# Patient Record
Sex: Male | Born: 1990 | Race: Black or African American | Hispanic: No | Marital: Married | Smoking: Former smoker
Health system: Southern US, Community
[De-identification: ages and names within clinical notes are randomized; demographics above are authoritative.]

## PROBLEM LIST (undated history)

## (undated) DIAGNOSIS — F419 Anxiety disorder, unspecified: Secondary | ICD-10-CM

---

## 2014-04-09 ENCOUNTER — Encounter (HOSPITAL_COMMUNITY): Payer: Self-pay | Admitting: Emergency Medicine

## 2014-04-09 ENCOUNTER — Emergency Department (HOSPITAL_COMMUNITY)
Admission: EM | Admit: 2014-04-09 | Discharge: 2014-04-09 | Disposition: A | Discharge status: Home / Self Care | Payer: Self-pay | Attending: Emergency Medicine | Admitting: Emergency Medicine

## 2014-04-09 DIAGNOSIS — Z720 Tobacco use: Secondary | ICD-10-CM | POA: Insufficient documentation

## 2014-04-09 DIAGNOSIS — K644 Residual hemorrhoidal skin tags: Secondary | ICD-10-CM | POA: Insufficient documentation

## 2014-04-09 MED ORDER — DIBUCAINE 1 % EX OINT: TOPICAL_OINTMENT | Freq: Four times a day (QID) | CUTANEOUS | Status: DC | PRN

## 2014-04-09 MED ORDER — DOCUSATE SODIUM 100 MG PO CAPS: 100.0000 mg | ORAL_CAPSULE | Freq: Two times a day (BID) | ORAL | Status: DC

## 2014-04-09 MED ORDER — HYDROCODONE-ACETAMINOPHEN 5-325 MG PO TABS: 1.0000 | ORAL_TABLET | ORAL | Status: DC | PRN

## 2014-04-09 MED ORDER — PHENYLEPH-SHARK LIV OIL-MO-PET 0.25-3-14-71.9 % RE OINT: 1.0000 "application " | TOPICAL_OINTMENT | Freq: Two times a day (BID) | RECTAL | Status: DC | PRN

## 2014-04-09 NOTE — ED Provider Notes (Signed)
CSN: [id number]     Arrival date & time 04/09/14  1538 History   First MD Initiated Contact with Patient 04/09/14 1735     Chief Complaint  Patient presents with  . Hemorrhoids     (Consider location/radiation/quality/duration/timing/severity/associated sxs/prior Treatment) HPI   This is a 23 year old male who presents emergency Department with chief complaint of hemorrhoid pain. Patient states he has had intermittent bouts of hemorrhoid flare up since the age of 23. He states that this is the worst one. He complains of a large external hemorrhoid that gives him pain. He denies any internal rectal pain or pain with defecation. The patient denies any fevers, chills, nausea, vomiting, or other signs of systemic infection. He has tried nothing at home except for cold sits baths, which he feels are helpful.   History reviewed. No pertinent past medical history. History reviewed. No pertinent past surgical history. History reviewed. No pertinent family history. History  Substance Use Topics  . Smoking status: Current Every Day Smoker  . Smokeless tobacco: Not on file  . Alcohol Use: Yes     Comment: occ    Review of Systems  Ten systems reviewed and are negative for acute change, except as noted in the HPI.    Allergies  Review of patient's allergies indicates no known allergies.  Home Medications   Prior to Admission medications   Medication Sig Start Date End Date Taking? Authorizing Provider  OVER THE COUNTER MEDICATION Apply 1 application topically daily as needed (hemmroidal pain).   Yes Historical Provider, MD  dibucaine (NUPERCAINAL) 1 % ointment Apply topically 4 (four) times daily as needed for pain. 04/09/14   Arthor CaptainAbigail Vernie Vinciguerra, PA-C  docusate sodium (COLACE) 100 MG capsule Take 1 capsule (100 mg total) by mouth every 12 (twelve) hours. 04/09/14   Arthor CaptainAbigail Kloi Brodman, PA-C  HYDROcodone-acetaminophen (NORCO) 5-325 MG per tablet Take 1 tablet by mouth every 4 (four) hours as  needed. 04/09/14   Arthor CaptainAbigail Lainy Wrobleski, PA-C  phenylephrine-shark liver oil-mineral oil-petrolatum (PREPARATION H) 0.25-3-14-71.9 % rectal ointment Place 1 application rectally 2 (two) times daily as needed for hemorrhoids. 04/09/14   Arthor CaptainAbigail Derryck Shahan, PA-C   BP 96/74 mmHg  Pulse 57  Temp(Src) 97.9 F (36.6 C) (Oral)  Resp 18  Ht 6\' 1"  (1.854 m)  Wt 171 lb (77.565 kg)  BMI 22.57 kg/m2  SpO2 100% Physical Exam  Constitutional: He is oriented to person, place, and time. He appears well-developed and well-nourished. No distress.  HENT:  Head: Normocephalic and atraumatic.  Eyes: Conjunctivae are normal. No scleral icterus.  Neck: Normal range of motion. Neck supple.  Cardiovascular: Normal rate, regular rhythm and normal heart sounds.   Pulmonary/Chest: Effort normal and breath sounds normal. No respiratory distress.  Abdominal: Soft. There is no tenderness.  Genitourinary: Rectal exam shows external hemorrhoid. Rectal exam shows anal tone normal.     Musculoskeletal: He exhibits no edema.  Neurological: He is alert and oriented to person, place, and time.  Skin: Skin is warm and dry. He is not diaphoretic.  Psychiatric: His behavior is normal.  Nursing note and vitals reviewed.   ED Course  Procedures (including critical care time) Labs Review Labs Reviewed - No data to display  Imaging Review No results found.   EKG Interpretation None      MDM   Final diagnoses:  External hemorrhoids without complication    Patient with uncomplicated external hemorrhoid. Will d/c with referral to [facility],  And supportive care. He is insisting immediate medical  care discussed.    Arthor CaptainAbigail Christiana Gurevich, PA-C 04/09/14 1842  Audree CamelScott T Goldston, MD 04/09/14 [phone]

## 2014-04-09 NOTE — ED Notes (Signed)
Pt c/o hemorrhoid that has grown in size and having pain x several days

## 2014-04-09 NOTE — Discharge Instructions (Signed)
Hemorrhoids °Hemorrhoids are swollen veins around the rectum or anus. There are two types of hemorrhoids:  °· Internal hemorrhoids. These occur in the veins just inside the rectum. They may poke through to the outside and become irritated and painful. °· External hemorrhoids. These occur in the veins outside the anus and can be felt as a painful swelling or hard lump near the anus. °CAUSES °· Pregnancy.   °· Obesity.   °· Constipation or diarrhea.   °· Straining to have a bowel movement.   °· Sitting for long periods on the toilet. °· Heavy lifting or other activity that caused you to strain. °· Anal intercourse. °SYMPTOMS  °· Pain.   °· Anal itching or irritation.   °· Rectal bleeding.   °· Fecal leakage.   °· Anal swelling.   °· One or more lumps around the anus.   °DIAGNOSIS  °Your caregiver may be able to diagnose hemorrhoids by visual examination. Other examinations or tests that may be performed include:  °· Examination of the rectal area with a gloved hand (digital rectal exam).   °· Examination of anal canal using a small tube (scope).   °· A blood test if you have lost a significant amount of blood. °· A test to look inside the colon (sigmoidoscopy or colonoscopy). °TREATMENT °Most hemorrhoids can be treated at home. However, if symptoms do not seem to be getting better or if you have a lot of rectal bleeding, your caregiver may perform a procedure to help make the hemorrhoids get smaller or remove them completely. Possible treatments include:  °· Placing a rubber band at the base of the hemorrhoid to cut off the circulation (rubber band ligation).   °· Injecting a chemical to shrink the hemorrhoid (sclerotherapy).   °· Using a tool to burn the hemorrhoid (infrared light therapy).   °· Surgically removing the hemorrhoid (hemorrhoidectomy).   °· Stapling the hemorrhoid to block blood flow to the tissue (hemorrhoid stapling).   °HOME CARE INSTRUCTIONS  °· Eat foods with fiber, such as whole grains, beans,  nuts, fruits, and vegetables. Ask your doctor about taking products with added fiber in them (fiber supplements). °· Increase fluid intake. Drink enough water and fluids to keep your urine clear or pale yellow.   °· Exercise regularly.   °· Go to the bathroom when you have the urge to have a bowel movement. Do not wait.   °· Avoid straining to have bowel movements.   °· Keep the anal area dry and clean. Use wet toilet paper or moist towelettes after a bowel movement.   °· Medicated creams and suppositories may be used or applied as directed.   °· Only take over-the-counter or prescription medicines as directed by your caregiver.   °· Take warm sitz baths for 15-20 minutes, 3-4 times a day to ease pain and discomfort.   °· Place ice packs on the hemorrhoids if they are tender and swollen. Using ice packs between sitz baths may be helpful.   °¨ Put ice in a plastic bag.   °¨ Place a towel between your skin and the bag.   °¨ Leave the ice on for 15-20 minutes, 3-4 times a day.   °· Do not use a donut-shaped pillow or sit on the toilet for long periods. This increases blood pooling and pain.   °SEEK MEDICAL CARE IF: °· You have increasing pain and swelling that is not controlled by treatment or medicine. °· You have uncontrolled bleeding. °· You have difficulty or you are unable to have a bowel movement. °· You have pain or inflammation outside the area of the hemorrhoids. °MAKE SURE YOU: °· Understand these instructions. °·   Will watch your condition. °· Will get help right away if you are not doing well or get worse. °Document Released: 04/08/2000 Document Revised: 03/28/2012 Document Reviewed: 02/14/2012 °[employer]® Patient Information ©2015 [employer]. This information is not intended to replace advice given to you by your health care provider. Make sure you discuss any questions you have with your health care provider. ° °Hemorrhoidectomy °Hemorrhoidectomy is surgery to remove hemorrhoids. Hemorrhoids are veins  that have become swollen in the rectum. The rectum is the area from the bottom end of the intestines to the opening where bowel movements leave the body. Hemorrhoids can be uncomfortable. They can cause itching, bleeding and pain if a blood clot forms in them (thrombose). If hemorrhoids are small, surgery may not be needed. But if they cover a larger area, surgery is usually suggested.  °LET YOUR CAREGIVER KNOW ABOUT:  °· Any allergies. °· All medications you are taking, including: °¨ Herbs, eyedrops, over-the-counter medications and creams. °¨ Blood thinners (anticoagulants), aspirin or other drugs that could affect blood clotting. °· Use of steroids (by mouth or as creams). °· Previous problems with anesthetics, including local anesthetics. °· Possibility of pregnancy, if this applies. °· Any history of blood clots. °· Any history of bleeding or other blood problems. °· Previous surgery. °· Smoking history. °· Other health problems. °RISKS AND COMPLICATIONS °All surgery carries some risk. However, hemorrhoid surgery usually goes smoothly. Possible complications could include: °· Urinary retention. °· Bleeding. °· Infection. °· A painful incision. °· A reaction to the anesthesia (this is not common). °BEFORE THE PROCEDURE  °· Stop using aspirin and non-steroidal anti-inflammatory drugs (NSAIDs) for pain relief. This includes prescription drugs and over-the-counter drugs such as ibuprofen and naproxen. Also stop taking vitamin E. If possible, do this two weeks before your surgery. °· If you take blood-thinners, ask your healthcare provider when you should stop taking them. °· You will probably have blood and urine tests done several days before your surgery. °· Do not eat or drink for about 8 hours before the surgery. °· Arrive at least an hour before the surgery, or whenever your surgeon recommends. This will give you time to check in and fill out any needed paperwork. °· Hemorrhoidectomy is often an outpatient  procedure. This means you will be able to go home the same day. Sometimes, though, people stay overnight in the hospital after the procedure. Ask your surgeon what to expect. Either way, make arrangements in advance for someone to drive you home. °PROCEDURE  °· The preparation: °¨ You will change into a hospital gown. °¨ You will be given an IV. A needle will be inserted in your arm. Medication can flow directly into your body through this needle. °¨ You might be given an enema to clear your rectum. °¨ Once in the operating room, you will probably lie on your side or be repositioned later to lying on your stomach. °¨ You will be given anesthesia (medication) so you will not feel anything during the surgery. The surgery often is done with local anesthesia (the area near the hemorrhoids will be numb and you will be drowsy but awake). Sometimes, general anesthesia is used (you will be asleep during the procedure). °· The procedure: °¨ There are a few different procedures for hemorrhoids. Be sure to ask you surgeon about the procedure, the risks and benefits. °¨ Be sure to ask about what you need to do to take care of the wound, if there is one. °AFTER THE PROCEDURE °·   You will stay in a recovery area until the anesthesia has worn off. Your blood pressure and pulse will be checked every so often. °· You may feel a lot of pain in the area of the rectum. °¨ Take all pain medication prescribed by your surgeon. Ask before taking any over-the-counter pain medicines. °¨ Sometimes sitting in a warm bath can help relieve your pain. °· To make sure you have bowel movements without straining: °¨ You will probably need to take stool softeners (usually a pill) for a few days. °¨ You should drink 8 to 10 glasses of water each day. °· Your activity will be restricted for awhile. Ask your caregiver for a list of what you should and should not do while you recover. °Document Released: 02/06/2009 Document Revised: 07/04/2011 Document  Reviewed: 02/06/2009 °[employer]® Patient Information ©2015 [employer]. This information is not intended to replace advice given to you by your health care provider. Make sure you discuss any questions you have with your health care provider. ° °

## 2015-03-06 ENCOUNTER — Emergency Department (HOSPITAL_COMMUNITY)
Admission: EM | Admit: 2015-03-06 | Discharge: 2015-03-06 | Disposition: A | Discharge status: Home / Self Care | Payer: Medicaid Other | Attending: Emergency Medicine | Admitting: Emergency Medicine

## 2015-03-06 ENCOUNTER — Encounter (HOSPITAL_COMMUNITY): Payer: Self-pay

## 2015-03-06 DIAGNOSIS — R200 Anesthesia of skin: Secondary | ICD-10-CM

## 2015-03-06 DIAGNOSIS — R202 Paresthesia of skin: Secondary | ICD-10-CM | POA: Insufficient documentation

## 2015-03-06 DIAGNOSIS — F410 Panic disorder [episodic paroxysmal anxiety] without agoraphobia: Secondary | ICD-10-CM

## 2015-03-06 DIAGNOSIS — R42 Dizziness and giddiness: Secondary | ICD-10-CM | POA: Diagnosis not present

## 2015-03-06 DIAGNOSIS — Z87891 Personal history of nicotine dependence: Secondary | ICD-10-CM | POA: Diagnosis not present

## 2015-03-06 DIAGNOSIS — F430 Acute stress reaction: Secondary | ICD-10-CM | POA: Diagnosis not present

## 2015-03-06 NOTE — ED Provider Notes (Signed)
Patient went for a morning jog this morning. One hour after he completed the run he developed pain in his left arm and left leg accompanied by lightheadedness. He also developed tingling in all of his fingers Symptoms are similar to panic attacks he has had in the past. He is presently asymptomatic without treatment. No other associated symptoms on exam alert classical coma score 15 lungs clear to auscultation heart regular rate and rhythm abdomen nondistended nontender all forks from his eye redness or tenderness neurovascular intact.Doug Sou.   Tadashi Burkel, MD 03/06/15 [phone]

## 2015-03-06 NOTE — ED Notes (Signed)
Urine at bedside. 

## 2015-03-06 NOTE — Discharge Instructions (Signed)
You have been seen today for arm and leg numbness. Follow up with PCP as needed. Return to ED should symptoms recur.    Emergency Department Resource Guide 1) Find a Doctor and Pay Out of Pocket Although you won't have to find out who is covered by your insurance plan, it is a good idea to ask around and get recommendations. You will then need to call the office and see if the doctor you have chosen will accept you as a new patient and what types of options they offer for patients who are self-pay. Some doctors offer discounts or will set up payment plans for their patients who do not have insurance, but you will need to ask so you aren't surprised when you get to your appointment.  2) Contact Your Local Health Department Not all health departments have doctors that can see patients for sick visits, but many do, so it is worth a call to see if yours does. If you don't know where your local health department is, you can check in your phone book. The CDC also has a tool to help you locate your state's health department, and many state websites also have listings of all of their local health departments.  3) Find a Walk-in Clinic If your illness is not likely to be very severe or complicated, you may want to try a walk in clinic. These are popping up all over the country in pharmacies, drugstores, and shopping centers. They're usually staffed by nurse practitioners or physician assistants that have been trained to treat common illnesses and complaints. They're usually fairly quick and inexpensive. However, if you have serious medical issues or chronic medical problems, these are probably not your best option.  No Primary Care Doctor: - Call Health Connect at  [phone] - they can help you locate a primary care doctor that  accepts your insurance, provides certain services, etc. - Physician Referral Service- [phone]  Chronic Pain Problems: Organization         Address  Phone   Notes  [facility]  [phone] Patients need to be referred by their primary care doctor.   Medication Assistance: Organization         Address  Phone   Notes  [facility] Medication [facility] [address] [place], [place] [address] [phone] --Must be a resident of [facility] -- Must have NO insurance coverage whatsoever (no Medicaid/ Medicare, etc.) -- The pt. MUST have a primary care doctor that directs their care regularly and follows them in the community   [facility]ist  [phone]   [employer]  [phone]    Agencies that provide inexpensive medical care: Organization         Address  Phone   Notes  [facility]  [phone]   [facility]    [phone]   [facility] [address] [place], [place] [address] [phone]   [facility] [address], [place] [phone]   Planned Parenthood    [phone]   [facility]    [phone]   [facility]  [address],  Phone:  [phone], Fax:  [phone] Hours of Operation:  9 am - 6 pm, M-F.  Also accepts Medicaid/Medicare and self-pay.  [facility]  [address], [employer] Phone: [phone])  [phone], Fax: [phone]. Hours of Operation:  8:30 am - 5:30 pm, M-F.  Also accepts Medicaid and self-pay.  [facility] [address], [place] Phone: [phone]   [place], [place], [place] [phone], Ext. [phone] Mondays & Thursdays: 7-9 AM.  First 15 patients are seen on a first come, first serve basis.    [place] Providers:  Organization         Address  Phone   Notes  [facility] [address], [place] [phone] Also accepts self-pay patients.  [facility] [address], [place]  [phone]   [place], [address], [place] [phone]   [facility] [address] [place] [phone]   Lucianne Lei [address], [place]   [phone] Only accepts [place] Access [place] patients after they have their name applied to their card.   Self-Pay (no insurance) in [facility]:  Organization         Address  Phone   Notes  Sickle Cell Patients, [facility] [place] [phone]   [facility] [place] [phone]   [facility] [place]  [place], [place],  [phone]   [facility]/Dr. Osei-Bonsu  [address], [place] or [address], [place] [phone] Phone number for both [place] and [place] locations is the same.  [facility] [address] [place] [phone]   [facility] [address], [place] or [address] [phone]   [facility] [address] [place] [phone], phone; [phone], fax Sees patients 1st and 3rd Saturday of every month.  Must not qualify for public or private insurance (i.e. Medicaid, Medicare, [place] Health Choice, Veterans' Benefits)  Household income should be no more than 200% of the poverty level The clinic cannot treat you if you are pregnant or think you are pregnant  Sexually transmitted diseases are not treated at the clinic.    Dental Care: Organization         Address  Phone  Notes  [facility] [place] [phone] Accepts children up to age 61 who are enrolled in [place] or [place]; pregnant women with a Medicaid card; and children who have applied for Medicaid  or [place] Health Choice, but were declined, whose parents can pay a reduced fee at time of service.  [facility] of [facility]  [address], [place] [phone] Accepts children up to age 65 who are enrolled in [place] or [place]; pregnant women with a Medicaid card; and children who have applied for Medicaid or [place] Health Choice, but were declined, whose parents can pay a reduced fee at time of service.  [facility]  [place] [phone] Patients are seen by appointment only. Walk-ins are not accepted. [place] will see patients 81 years of age and older. Monday - Tuesday (8am-5pm) Most Wednesdays (8:30-5pm) $30 per visit, cash only  [facility]  [address], [employer]  [place] [phone] Patients are seen by appointment only. Walk-ins are not accepted. [place] will see patients 62 years of age and older. One Wednesday Evening (Monthly: Volunteer Based).  $30 per visit, cash only  [place]  [phone] for adults; Children under age 70, call Graduate Pediatric Dentistry at [phone]. Children aged 36-14, please call [phone] to request a pediatric application.  Dental services are provided in all areas of dental care including fillings, crowns and bridges, complete and partial dentures, implants, gum treatment, root canals, and extractions. Preventive care is also provided. Treatment is provided to both adults and children. Patients are selected via a lottery and there is often a waiting list.   [facility] [address] [place]  [phone] [email or web address]   [facility] [address] [place], [place] [phone], Ext. [phone] Second and Fourth Thursday of each month, opens at 6:30 AM; Clinic ends at 9 AM.  Patients are seen on a first-come first-served basis, and a limited number are seen  during each clinic.   [facility]  [address] [place], [place] [phone]   Eligibility Requirements You must have lived in [place], [place], or [place] counties for at least the last three months.   You cannot be eligible for state or federal sponsored [employer], including [employer], [place], or [employer].   You generally cannot be eligible for healthcare insurance through your employer.    How to apply: Eligibility screenings are held every Tuesday and Wednesday afternoon from 1:00 pm until 4:00 pm. You do not need an appointment for the interview!  [facility] [address] [place], [place]   [place]  [place] Department  [place]  [phone]    Behavioral Health Resources in the Community: Intensive Outpatient Programs Organization         Address  Phone  Notes  [facility] [place]. [address], [place], [place] [phone]   [facility] [address] [place], [place]   ADS: [facility] Drug S[facility]cs [address], [place], [place]   [place] [address],  [place], [place] or [phone]   Substance Abuse Resources Organization         Address  Phone  Notes  Alcohol [facility] Drug Services  [phone]   [place]  [phone]   [facility] [place]   Chinita Pester  [phone]   Residential & Outpatient Substance Abuse Program  [phone]   Psychological Services Organization         Address  Phone  Notes  [facility]  [place]  [phone]   [place] [address], [place] or [phone]    Mobile Crisis Teams Organization         Address  Phone  Notes  [facility],  Mobile Crisis Care Unit  [phone]   [facility]  [address] [place], [place]   [place] Levels [address] [place] [phone]    Self-Help/Support Groups Organization         Address  Phone             Notes  [employer] of [place] - variety of support groups  336- [phone] Call for more information  Narcotics Anonymous (NA), [facility] [address], [place]  2 meetings at this location   Residential Sports administratorTreatment Programs Organization         Address  Phone  Notes  [facility] [address],    [place]  [phone]   [facility]  [address], [place] [phone], [place], [place] [phone]   [facility] [address], [place] [phone] Admissions: 8am-3pm M-F  [facility] [address]    [place], [place] [phone]   [facility] [address], [place], [place] [phone]   [facility] [address]  [place], [place] [phone]   [facility] - Intensive Outpatient [address], [place], [place] [phone]   [facility] ([facility] [address]  [place], [place] [phone] or [phone]   [facility] (RTS) [address] [place], [place] [phone] Accepts Medicaid  [facility] [address]  [place] [phone] Substance Abuse/Addiction Treatment   [facility] Behavioral Health Resources Organization         Address  Phone  Notes  [facility]  [phone]) [phone]   Angie FavaJulie Brannon, PhD [address] [place], [place]   [phone] or [phone]) [phone]   [facility]   [address] [place], [place] [phone]) [phone]   [facility] [address], [place], [place] [phone]) [phone] Insurance/Medicaid/sponsorship through [facility]  [facility] [address]                                    [place], [place] [phone]) [phone] Therapy/tele-psych/case  [facility] [address]   [place], [place] [phone](336) [phone]    Dr. Lolly MustacheArfeen  [phone]   [facility]  [employer] [facility] 1) [address], [place] 2) [address], [place] 3)  [address], [place] [phone](336) [phone]  [phone]   [facility] Child Abuse Hotline [phone]) [phone] or [phone](336) [phone] (After Hours)

## 2015-03-06 NOTE — ED Provider Notes (Signed)
CSN: [id number]     Arrival date & time 03/06/15  1348 History   None    Chief Complaint  Patient presents with  . Numbness  . Dizziness     (Consider location/radiation/quality/duration/timing/severity/associated sxs/prior Treatment) HPI   Jeffrey CroakKenneth Oneal is a 24 y.o. male, with a history of Anxiety and Panic Attacks, presenting to the ED with tightness and tingling in left arm and left leg that came on after a run this morning. Describes the tingling, "like my arm and leg fell asleep."  At the time of this interview, pt states he feels normal with no parasthesias, but complains of some muscle soreness, "like I was tensing my muscles up." Pt rates this soreness at about 3/10 and non-radiating. Complains of dizziness once here in the ED. Pt has a history of anxiety and panic attacks and states that his dizziness was probably due to him working himself up from worrying about the arm and leg tingling.  Pt states he has never taken a medication for his anxiety or panic attacks and is usually able to calm himself down. Pt states he has been very stressed lately and attributes to this worse panic attack to his extra stress. Pt has not taken any medications since this episode today. Denies shortness of breath, chest pain or tightness, recent illness, nausea or vomiting, weakness, or any other complaints.  History reviewed. No pertinent past medical history. History reviewed. No pertinent past surgical history. History reviewed. No pertinent family history. Social History  Substance Use Topics  . Smoking status: Former Games developermoker  . Smokeless tobacco: None  . Alcohol Use: Yes     Comment: occ    Review of Systems  Respiratory: Negative for shortness of breath.   Cardiovascular: Negative for chest pain.  Gastrointestinal: Negative for nausea and vomiting.  Neurological: Positive for dizziness and numbness.  All other systems reviewed and are negative.     Allergies  Review of patient's  allergies indicates no known allergies.  Home Medications   Prior to Admission medications   Not on File   BP 116/86 mmHg  Pulse 80  Temp(Src) 97.5 F (36.4 C) (Oral)  Resp 18  SpO2 100% Physical Exam  Constitutional: He is oriented to person, place, and time. He appears well-developed and well-nourished. No distress.  HENT:  Head: Normocephalic and atraumatic.  Eyes: Conjunctivae and EOM are normal. Pupils are equal, round, and reactive to light.  Neck: Normal range of motion. Neck supple.  Cardiovascular: Normal rate, regular rhythm, normal heart sounds and intact distal pulses.   Pulmonary/Chest: Effort normal and breath sounds normal. No respiratory distress.  Abdominal: Soft. Bowel sounds are normal.  Musculoskeletal: Normal range of motion. He exhibits no edema or tenderness.  Lymphadenopathy:    He has no cervical adenopathy.  Neurological: He is alert and oriented to person, place, and time. He has normal reflexes.  No sensory deficits. Strength 5/5 in all extremities. No gait disturbance. Cranial nerves II-XII grossly intact.  Skin: Skin is warm and dry. He is not diaphoretic.  Nursing note and vitals reviewed.   ED Course  Procedures (including critical care time) Labs Review Labs Reviewed - No data to display  Imaging Review No results found. I have personally reviewed and evaluated these images and lab results as part of my medical decision-making.   EKG Interpretation   Date/Time:  Friday March 06 2015 14:06:20 EST Ventricular Rate:  95 PR Interval:  155 QRS Duration: 93 QT Interval:  357  QTC Calculation: 449 R Axis:   71 Text Interpretation:  Sinus rhythm Borderline T wave abnormalities No old  tracing to compare Confirmed by Ethelda Chick  MD, SAM [phone]) on 03/06/2015  4:19:53 PM      MDM   Final diagnoses:  Numbness and tingling of left arm and leg  Panic attack as reaction to stress    Jeffrey Oneal presents with a panic attack that  presented with left arm and leg tingling.   Findings and plan of care discussed with Doug Sou, MD.  That this was a panic attack. All symptoms have resolved, patient feels normal, and he has a completely benign exam. EKG showed sinus rhythm with no ectopics. Plan to observe patient here in the ED and then discharged to home. Plan of care communicated with patient, who agreed to the plan and is comfortable with discharge.    Anselm Pancoast, PA-C 03/06/15 1629  Doug Sou, MD 03/07/15 [phone]

## 2015-03-06 NOTE — ED Notes (Signed)
Pt c/o chest tightness and extremity numbness/tingling since 1230.  Pain score 7/10.  Pt reports that she was "just talking to his girlfriend" when the symptoms began.  Sts "I have anxiety attacks, but I don't take anything for anxiety."  Equal BUE grips, pushes, and pulls noted.

## 2015-03-06 NOTE — Progress Notes (Signed)
Pt verified coverge did indicate a uhc dr - Bronson Curbaniel [place]ermann in [place] Cm provided pt with a list of in network [facility] providers in zip code [phone]  Pt appreciative of care

## 2015-05-01 ENCOUNTER — Encounter (HOSPITAL_COMMUNITY): Payer: Self-pay | Admitting: Family Medicine

## 2015-05-01 ENCOUNTER — Emergency Department (HOSPITAL_COMMUNITY)
Admission: EM | Admit: 2015-05-01 | Discharge: 2015-05-01 | Discharge status: Against Medical Advice | Payer: Medicaid Other | Attending: Emergency Medicine | Admitting: Emergency Medicine

## 2015-05-01 DIAGNOSIS — F430 Acute stress reaction: Secondary | ICD-10-CM | POA: Diagnosis present

## 2015-05-01 DIAGNOSIS — F410 Panic disorder [episodic paroxysmal anxiety] without agoraphobia: Secondary | ICD-10-CM | POA: Insufficient documentation

## 2015-05-01 NOTE — ED Notes (Signed)
Pt here for panic attack after smoking some marijuana. sts that he has been stressed out. Denies SI or HI.

## 2015-10-18 ENCOUNTER — Encounter (HOSPITAL_COMMUNITY): Payer: Self-pay | Admitting: *Deleted

## 2015-10-18 ENCOUNTER — Emergency Department (HOSPITAL_COMMUNITY)
Admission: EM | Admit: 2015-10-18 | Discharge: 2015-10-18 | Disposition: A | Discharge status: Home / Self Care | Payer: 59 | Attending: Emergency Medicine | Admitting: Emergency Medicine

## 2015-10-18 DIAGNOSIS — X58XXXA Exposure to other specified factors, initial encounter: Secondary | ICD-10-CM | POA: Insufficient documentation

## 2015-10-18 DIAGNOSIS — S29012A Strain of muscle and tendon of back wall of thorax, initial encounter: Secondary | ICD-10-CM

## 2015-10-18 DIAGNOSIS — Y939 Activity, unspecified: Secondary | ICD-10-CM | POA: Insufficient documentation

## 2015-10-18 DIAGNOSIS — Y929 Unspecified place or not applicable: Secondary | ICD-10-CM | POA: Insufficient documentation

## 2015-10-18 DIAGNOSIS — Y999 Unspecified external cause status: Secondary | ICD-10-CM | POA: Insufficient documentation

## 2015-10-18 DIAGNOSIS — Z87891 Personal history of nicotine dependence: Secondary | ICD-10-CM | POA: Insufficient documentation

## 2015-10-18 MED ORDER — METHOCARBAMOL 500 MG PO TABS: 500.0000 mg | ORAL_TABLET | Freq: Two times a day (BID) | ORAL | Status: DC

## 2015-10-18 NOTE — ED Notes (Signed)
Pt reports bask spasms to right upper back. Was unable to work yesterday and needs work note. Ambulatory at triage.

## 2015-10-18 NOTE — ED Provider Notes (Signed)
CSN: [id number]     Arrival date & time 10/18/15  1209 History  By signing my name below, I, Tanda RockersMargaux Venter, attest that this documentation has been prepared under the direction and in the presence of Audry Piliyler Jonnae Fonseca, PA-C.  Electronically Signed: Tanda RockersMargaux Venter, ED Scribe. 10/18/2015. 1:04 PM.   Chief Complaint  Patient presents with  . Back Pain   The history is provided by the patient. No language interpreter was used.    HPI Comments: Jeffrey Oneal is a 25 y.o. male who presents to the Emergency Department complaining of gradual onset, constant, right upper back pain and spasms x 4 days. Pt reports that he was unloading a truck at work when he began having mild pain to the area that has since worsened. Pt has had similar pain in the past after lifting weights which he believes was a pinched nerve. He has been applying a heating pad with mild relief. Pt has not taken any medications for the pain. He is also requesting a work note for missing work yesterday due to the pain. Denies fever, chills, urinary or bowel incontinence, saddle anesthesia, weakness, numbness, tingling, or any other associated symptoms.   History reviewed. No pertinent past medical history. History reviewed. No pertinent past surgical history. History reviewed. No pertinent family history. Social History  Substance Use Topics  . Smoking status: Former Games developermoker  . Smokeless tobacco: None  . Alcohol Use: Yes     Comment: occ    Review of Systems  Constitutional: Negative for fever and chills.  Gastrointestinal:       Negative for bowel incontinence  Genitourinary:       Negative for urinary incontinence  Musculoskeletal: Positive for back pain.  Neurological: Negative for weakness and numbness.   Allergies  Review of patient's allergies indicates no known allergies.  Home Medications   Prior to Admission medications   Not on File   BP 109/80 mmHg  Pulse 55  Temp(Src) 98.4 F (36.9 C) (Oral)  Resp 18  SpO2  100%   Physical Exam  Constitutional: He is oriented to person, place, and time. He appears well-developed and well-nourished. No distress.  HENT:  Head: Normocephalic and atraumatic.  Eyes: Conjunctivae and EOM are normal.  Neck: Neck supple. No tracheal deviation present.  Cardiovascular: Normal rate.   Pulmonary/Chest: Effort normal. No respiratory distress.  Musculoskeletal: Normal range of motion. He exhibits tenderness.       Cervical back: Normal.       Thoracic back: Normal.       Lumbar back: Normal.  TTP along right trapezius next to thoracic spine No tenderness on left.  No spinous process tenderness  Neurological: He is alert and oriented to person, place, and time.  Skin: Skin is warm and dry.  Psychiatric: He has a normal mood and affect. His behavior is normal.  Nursing note and vitals reviewed.  ED Course  Procedures (including critical care time)  DIAGNOSTIC STUDIES: Oxygen Saturation is 100% on RA, normal by my interpretation.    COORDINATION OF CARE: 1:01 PM-Discussed treatment plan which includes Rx muscle relaxant with pt at bedside and pt agreed to plan.   Labs Review Labs Reviewed - No data to display  Imaging Review No results found. I have personally reviewed and evaluated these images and lab results as part of my medical decision-making.   EKG Interpretation None      MDM  I have reviewed the relevant previous healthcare records. I obtained HPI from  historian.  ED Course:  Assessment: Patient with back pain.  No neurological deficits and normal neuro exam.  Patient is ambulatory.  No loss of bowel or bladder control.  No concern for cauda equina.  No fever, night sweats, weight loss, h/o cancer, IVDA, no recent procedure to back. No urinary symptoms suggestive of UTI.  Supportive care and return precaution discussed. Likely muscle strain. Pt agreeable to plan and discharge. Appears safe for discharge at this time. Follow up as indicated in  discharge paperwork.   Disposition/Plan:  DC home Additional Verbal discharge instructions given and discussed with patient.  Pt Instructed to f/u with PCP in the next week for evaluation and treatment of symptoms. Return precautions given Pt acknowledges and agrees with plan  Supervising Physician Tilden FossaElizabeth Rees, MD   Final diagnoses:  Muscle strain of right upper back, initial encounter     I personally performed the services described in this documentation, which was scribed in my presence. The recorded information has been reviewed and is accurate.      Audry Piliyler Dorothie Wah, PA-C 10/18/15 1308  Tilden FossaElizabeth Rees, MD 10/19/15 0700

## 2015-10-18 NOTE — Discharge Instructions (Signed)
Please read and follow all provided instructions.  Your diagnoses today include:  1. Muscle strain of right upper back, initial encounter    Tests performed today include:  Vital signs. See below for your results today.   Medications prescribed:   Take as prescribed   Home care instructions:  Follow any educational materials contained in this packet.  Follow-up instructions: Please follow-up with your primary care provider for further evaluation of symptoms and treatment   Return instructions:   Please return to the Emergency Department if you do not get better, if you get worse, or new symptoms OR  - Fever (temperature greater than 101.8F)  - Bleeding that does not stop with holding pressure to the area    -Severe pain (please note that you may be more sore the day after your accident)  - Chest Pain  - Difficulty breathing  - Severe nausea or vomiting  - Inability to tolerate food and liquids  - Passing out  - Skin becoming red around your wounds  - Change in mental status (confusion or lethargy)  - New numbness or weakness     Please return if you have any other emergent concerns.  Additional Information:  Your vital signs today were: BP 109/80 mmHg   Pulse 55   Temp(Src) 98.4 F (36.9 C) (Oral)   Resp 18   SpO2 100% If your blood pressure (BP) was elevated above 135/85 this visit, please have this repeated by your doctor within one month. ---------------

## 2015-10-18 NOTE — ED Notes (Signed)
Declined W/C at D/C and was escorted to lobby by RN. 

## 2015-12-06 ENCOUNTER — Emergency Department (HOSPITAL_COMMUNITY)
Admission: EM | Admit: 2015-12-06 | Discharge: 2015-12-06 | Disposition: A | Discharge status: Home / Self Care | Payer: Medicaid Other | Attending: Dermatology | Admitting: Dermatology

## 2015-12-06 ENCOUNTER — Encounter (HOSPITAL_COMMUNITY): Payer: Self-pay | Admitting: Emergency Medicine

## 2015-12-06 DIAGNOSIS — Z5321 Procedure and treatment not carried out due to patient leaving prior to being seen by health care provider: Secondary | ICD-10-CM | POA: Insufficient documentation

## 2015-12-06 DIAGNOSIS — F419 Anxiety disorder, unspecified: Secondary | ICD-10-CM | POA: Insufficient documentation

## 2015-12-06 HISTORY — DX: Anxiety disorder, unspecified: F41.9

## 2015-12-06 NOTE — ED Notes (Signed)
Looked in room. Pt no longer there with significant other.

## 2015-12-06 NOTE — ED Triage Notes (Signed)
Pt reports he was very anxious earlier today due mother's end of life care. Pt then began to have bilateral hand spasms and tingling and then had a syncopal episode. Pt denies any pain. Hx of panic attacks, but has not had one this bad.

## 2015-12-06 NOTE — ED Notes (Signed)
Pt wants to leave prior to being seen. Pt calmer at present, moving hands, and eating a doughnut with his significant other. Explained need for blood work. Pt and significant other verbalized understanding.

## 2017-06-03 ENCOUNTER — Encounter (HOSPITAL_COMMUNITY): Payer: Self-pay | Admitting: *Deleted

## 2017-06-03 ENCOUNTER — Other Ambulatory Visit: Payer: Self-pay

## 2017-06-03 ENCOUNTER — Emergency Department (HOSPITAL_COMMUNITY)
Admission: EM | Admit: 2017-06-03 | Discharge: 2017-06-03 | Disposition: A | Discharge status: Home / Self Care | Payer: 59 | Attending: Emergency Medicine | Admitting: Emergency Medicine

## 2017-06-03 DIAGNOSIS — J069 Acute upper respiratory infection, unspecified: Secondary | ICD-10-CM

## 2017-06-03 DIAGNOSIS — Z87891 Personal history of nicotine dependence: Secondary | ICD-10-CM | POA: Insufficient documentation

## 2017-06-03 DIAGNOSIS — Z79899 Other long term (current) drug therapy: Secondary | ICD-10-CM | POA: Insufficient documentation

## 2017-06-03 LAB — INFLUENZA PANEL BY PCR (TYPE A & B)
INFLBPCR: NEGATIVE
Influenza A By PCR: NEGATIVE

## 2017-06-03 MED ORDER — DEXAMETHASONE SODIUM PHOSPHATE 10 MG/ML IJ SOLN
10.0000 mg | Freq: Once | INTRAMUSCULAR | Status: AC
  Administered 2017-06-03: 10 mg via INTRAMUSCULAR
  Filled 2017-06-03: qty 1

## 2017-06-03 MED ORDER — OSELTAMIVIR PHOSPHATE 75 MG PO CAPS: 75.0000 mg | ORAL_CAPSULE | Freq: Two times a day (BID) | ORAL | 0 refills | Status: DC

## 2017-06-03 MED ORDER — PREDNISONE 10 MG PO TABS: 20.0000 mg | ORAL_TABLET | Freq: Every day | ORAL | 0 refills | Status: AC

## 2017-06-03 MED ORDER — FLUTICASONE PROPIONATE 50 MCG/ACT NA SUSP: 2.0000 | Freq: Every day | NASAL | 0 refills | Status: DC

## 2017-06-03 NOTE — ED Notes (Signed)
Pt understood dc material. NAD Noted. Scripts given at dc. 

## 2017-06-03 NOTE — ED Triage Notes (Signed)
Headache also 

## 2017-06-03 NOTE — ED Provider Notes (Signed)
[facility] Provider Note   CSN: [id number] Arrival date & time: 06/03/17  2041     History   Chief Complaint Chief Complaint  Patient presents with  . Generalized Body Aches    HPI Jeffrey CroakKenneth Nanez is a 27 y.o. male presenting for evaluation of nasal congestion and sneezing.  Pt states that yesterday, he started to develop dry throat, nasal congestion, and increased sneezing.  He reports bilateral eye irritation.  He has had subjective fevers, and reports he has been feeling more tired than normal.  He has not tried anything for his symptoms.  He denies ear pain, sore throat, cough, chest pain, difficulty breathing, nausea, vomiting, abdominal pain, urinary symptoms, abnormal bowel movements.  He has no other medical problems, does not take medications daily.  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  Multiple family members at home are sick with similar symptoms.  HPI  Past Medical History:  Diagnosis Date  . Anxiety     There are no active problems to display for this patient.   History reviewed. No pertinent surgical history.     Home Medications    Prior to Admission medications   Medication Sig Start Date End Date Taking? Authorizing Provider  fluticasone (FLONASE) 50 MCG/ACT nasal spray Place 2 sprays into both nostrils daily. 06/03/17   Keven Osborn, PA-C  methocarbamol (ROBAXIN) 500 MG tablet Take 1 tablet (500 mg total) by mouth 2 (two) times daily. 10/18/15   Audry PiliMohr, Tyler, PA-C  oseltamivir (TAMIFLU) 75 MG capsule Take 1 capsule (75 mg total) by mouth every 12 (twelve) hours. 06/03/17   Dericka Ostenson, PA-C  predniSONE (DELTASONE) 10 MG tablet Take 2 tablets (20 mg total) by mouth daily for 4 days. 06/03/17 06/07/17  Antionetta Ator, PA-C    Family History No family history on file.  Social History Social History   Tobacco Use  . Smoking status: Former Games developermoker  . Smokeless tobacco: Never  Used  Substance Use Topics  . Alcohol use: Yes    Comment: occ  . Drug use: Yes    Frequency: 3.0 times per week    Types: Marijuana     Allergies   Patient has no known allergies.   Review of Systems Review of Systems  Constitutional: Positive for fever (subjective).  HENT: Positive for congestion. Negative for sore throat.   Eyes:       Eye irritation  Respiratory: Negative for cough, chest tightness and shortness of breath.   Cardiovascular: Negative for chest pain.  Gastrointestinal: Negative for abdominal pain, nausea and vomiting.     Physical Exam Updated Vital Signs BP 136/79 (BP Location: Right Arm)   Pulse 86   Temp 98.7 F (37.1 C) (Oral)   Resp 16   Ht 6\' 1"  (1.854 m)   Wt 95.3 kg (210 lb)   SpO2 100%   BMI 27.71 kg/m   Physical Exam  Constitutional: He is oriented to person, place, and time. He appears well-developed and well-nourished. No distress.  HENT:  Head: Normocephalic and atraumatic.  Right Ear: Tympanic membrane, external ear and ear canal normal.  Left Ear: Tympanic membrane, external ear and ear canal normal.  Nose: Mucosal edema present. Right sinus exhibits no maxillary sinus tenderness and no frontal sinus tenderness. Left sinus exhibits no maxillary sinus tenderness and no frontal sinus tenderness.  Mouth/Throat: Uvula is midline, oropharynx is clear and moist and mucous membranes are normal. No tonsillar  exudate.  Nasal mucosal edema.  OP clear without tonsillar swelling or exudate.  Uvula midline with equal palate rise.  TMs nonerythematous and not bulging bilaterally.  Eyes: Conjunctivae and EOM are normal. Pupils are equal, round, and reactive to light. Right eye exhibits no discharge. Left eye exhibits no discharge.  No conjunctival or scleral injection or drainage.  Neck: Normal range of motion.  Cardiovascular: Normal rate, regular rhythm and intact distal pulses.  Pulmonary/Chest: Effort normal and breath sounds normal. He has  no decreased breath sounds. He has no wheezes. He has no rhonchi. He has no rales.  Pt speaking in full sentences without difficulty. Clear lung sounds in all fields.   Abdominal: Soft. He exhibits no distension. There is no tenderness.  Musculoskeletal: Normal range of motion.  Lymphadenopathy:    He has cervical adenopathy.  Neurological: He is alert and oriented to person, place, and time.  Skin: Skin is warm.  Psychiatric: He has a normal mood and affect.  Nursing note and vitals reviewed.    ED Treatments / Results  Labs (all labs ordered are listed, but only abnormal results are displayed) Labs Reviewed  INFLUENZA PANEL BY PCR (TYPE A & B)    EKG  EKG Interpretation None       Radiology No results found.  Procedures Procedures (including critical care time)  Medications Ordered in ED Medications  dexamethasone (DECADRON) injection 10 mg (10 mg Intramuscular Given 06/03/17 2157)     Initial Impression / Assessment and Plan / ED Course  I have reviewed the triage vital signs and the nursing notes.  Pertinent labs & imaging results that were available during my care of the patient were reviewed by me and considered in my medical decision making (see chart for details).     Patient presenting for evaluation of URI symptoms that began yesterday.  Physical exam reassuring, he is afebrile, tachycardia improved without treatment.  He appears nontoxic.  Pulmonary exam reassuring, doubt pneumonia. Doubt strep, AOM, or other bacterial infection.  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  Doubt flu, as pt does not have high fevers or cough,  however will swab as patient requests.  Likely viral URI.  Discussed findings with patient.  Discussed symptomatic treatment.  Will prescribe Tamiflu for patient to fill if flu is positive.  Decadron given in the ER for symptom relief.  At this time, patient appears to be discharge.  Return precautions given.  Patient states he  understands and agrees to plan.   Final Clinical Impressions(s) / ED Diagnoses   Final diagnoses:  Upper respiratory tract infection, unspecified type    ED Discharge Orders        Ordered    fluticasone (FLONASE) 50 MCG/ACT nasal spray  Daily     06/03/17 2142    predniSONE (DELTASONE) 10 MG tablet  Daily     06/03/17 2142    oseltamivir (TAMIFLU) 75 MG capsule  Every 12 hours     06/03/17 2142       Raneen Jaffer, PA-C 06/03/17 2251    Tilden Fossa, MD 06/04/17 [phone]

## 2017-06-03 NOTE — Discharge Instructions (Signed)
You likely have a viral illness.  This should be treated symptomatically. Use Tylenol or ibuprofen as needed for fevers or body aches. Use Flonase daily for nasal congestion and cough. Take prednisone daily.  If flu test is positive, you may start tamiflu.  Make sure you stay well-hydrated with water. Wash your hands frequently to prevent spread of infection. Return to the emergency room if you develop chest pain, difficulty breathing, or any new or worsening symptoms.

## 2017-06-03 NOTE — ED Triage Notes (Signed)
The p[t has a cold and a cough with body aches poss temp

## 2017-07-10 ENCOUNTER — Other Ambulatory Visit: Payer: Self-pay

## 2017-07-10 ENCOUNTER — Emergency Department (HOSPITAL_COMMUNITY): Admission: EM | Admit: 2017-07-10 | Discharge: 2017-07-10 | Discharge status: Against Medical Advice | Payer: 59

## 2017-08-01 ENCOUNTER — Encounter (HOSPITAL_COMMUNITY): Payer: Self-pay

## 2017-08-01 ENCOUNTER — Other Ambulatory Visit: Payer: Self-pay

## 2017-08-01 ENCOUNTER — Emergency Department (HOSPITAL_COMMUNITY)
Admission: EM | Admit: 2017-08-01 | Discharge: 2017-08-01 | Disposition: A | Discharge status: Home / Self Care | Payer: 59 | Attending: Emergency Medicine | Admitting: Emergency Medicine

## 2017-08-01 DIAGNOSIS — J029 Acute pharyngitis, unspecified: Secondary | ICD-10-CM

## 2017-08-01 DIAGNOSIS — R058 Other specified cough: Secondary | ICD-10-CM

## 2017-08-01 DIAGNOSIS — R05 Cough: Secondary | ICD-10-CM | POA: Insufficient documentation

## 2017-08-01 DIAGNOSIS — Z87891 Personal history of nicotine dependence: Secondary | ICD-10-CM | POA: Insufficient documentation

## 2017-08-01 LAB — GROUP A STREP BY PCR: GROUP A STREP BY PCR: NOT DETECTED

## 2017-08-01 MED ORDER — DEXAMETHASONE SODIUM PHOSPHATE 10 MG/ML IJ SOLN
10.0000 mg | Freq: Once | INTRAMUSCULAR | Status: AC
  Administered 2017-08-01: 10 mg via INTRAMUSCULAR
  Filled 2017-08-01: qty 1

## 2017-08-01 NOTE — ED Provider Notes (Signed)
[facility] EMERGENCY DEPARTMENT Provider Note   CSN: [id number] Arrival date & time: 08/01/17  0807     History   Chief Complaint No chief complaint on file.   HPI Jeffrey Oneal is a 27 y.o. male resents today for evaluation of acute onset, progressively worsening sore throat and nonproductive cough since yesterday. He states that yesterday he awoke feeling a slight tingle to the back of his throat which worsened overnight. He notes pain with swallowing but denies drooling, throat tightness, or facial swelling. He denies fevers or chills. He denies nasal congestion. He does endorses nonproductive cough but denies shortness of breath or chest pain. He states the pain  In the throatis sharp and radiates to the right ear.he has tried drinking diluted apple cider vinegar with some relief of his symptoms. No other medications prior to arrival. 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 He is a nonsmoker. The history is provided by the patient.    Past Medical History:  Diagnosis Date  . Anxiety     There are no active problems to display for this patient.   History reviewed. No pertinent surgical history.      Home Medications    Prior to Admission medications   Medication Sig Start Date End Date Taking? Authorizing Provider  fluticasone (FLONASE) 50 MCG/ACT nasal spray Place 2 sprays into both nostrils daily. Patient not taking: Reported on 08/01/2017 06/03/17   Caccavale, Sophia, PA-C  methocarbamol (ROBAXIN) 500 MG tablet Take 1 tablet (500 mg total) by mouth 2 (two) times daily. Patient not taking: Reported on 08/01/2017 10/18/15   Audry Pili, PA-C    Family History No family history on file.  Social History Social History   Tobacco Use  . Smoking status: Former Games developer  . Smokeless tobacco: Never Used  Substance Use Topics  . Alcohol use: Yes    Comment: occ  . Drug use: Yes    Frequency: 3.0 times per week    Types: Marijuana      Allergies   Patient has no known allergies.   Review of Systems Review of Systems  Constitutional: Negative for chills and fever.  HENT: Positive for ear pain and sore throat. Negative for ear discharge, facial swelling and trouble swallowing.   Respiratory: Positive for cough. Negative for shortness of breath.   Cardiovascular: Negative for chest pain.     Physical Exam Updated Vital Signs BP 120/76 (BP Location: Right Arm)   Pulse 88   Temp 98.7 F (37.1 C) (Oral)   Resp 18   SpO2 96%   Physical Exam  Constitutional: He appears well-developed and well-nourished. No distress.  HENT:  Head: Normocephalic and atraumatic.  Right Ear: Tympanic membrane, external ear and ear canal normal.  Left Ear: Tympanic membrane, external ear and ear canal normal.  Nose: Nose normal. Right sinus exhibits no maxillary sinus tenderness and no frontal sinus tenderness. Left sinus exhibits no maxillary sinus tenderness and no frontal sinus tenderness.  Mouth/Throat: Uvula is midline and mucous membranes are normal. No trismus in the jaw. No uvula swelling. Posterior oropharyngeal edema and posterior oropharyngeal erythema present. No oropharyngeal exudate or tonsillar abscesses. Tonsils are 2+ on the right. Tonsils are 2+ on the left. No tonsillar exudate.  Speaking in full sentences, tolerating secretions without difficulty.  Eyes: Conjunctivae are normal. Right eye exhibits no discharge. Left eye exhibits no discharge.  Neck: Trachea normal, normal range of motion, full passive range of motion  without pain and phonation normal. Neck supple. No JVD present. No tracheal deviation present. No thyroid mass and no thyromegaly present.  Left anterior cervical lymphadenopathy.  Cardiovascular: Normal rate, regular rhythm and normal heart sounds.  Pulmonary/Chest: Effort normal and breath sounds normal. No stridor. No respiratory distress. He has no wheezes. He has no rales. He exhibits no  tenderness.  Equal rise and fall of chest, no increased work of breathing, speaking in full sentences without difficulty.  Abdominal: He exhibits no distension.  Musculoskeletal: He exhibits no edema.  Lymphadenopathy:    He has cervical adenopathy.  Neurological: He is alert.  Skin: Skin is warm and dry. No erythema.  Psychiatric: He has a normal mood and affect. His behavior is normal.  Nursing note and vitals reviewed.    ED Treatments / Results  Labs (all labs ordered are listed, but only abnormal results are displayed) Labs Reviewed  GROUP A STREP BY PCR  RAPID STREP SCREEN (NOT AT [facility])    EKG None  Radiology No results found.  Procedures Procedures (including critical care time)  Medications Ordered in ED Medications  dexamethasone (DECADRON) injection 10 mg (has no administration in time range)     Initial Impression / Assessment and Plan / ED Course  I have reviewed the triage vital signs and the nursing notes.  Pertinent labs & imaging results that were available during my care of the patient were reviewed by me and considered in my medical decision making (see chart for details).     Pt afebrile without tonsillar exudate, negative strep. Presents with mild cervical lymphadenopathy, & dysphagia; diagnosis of viral pharyngitis.he is afebrile, vital signs are stable. He is nontoxic in appearance. Cough is nonproductive and lungs are clear to auscultation bilaterally, I doubt pneumonia or acute cardiopulmonary abnormality. Symptoms consistent with viral pharyngitis/viral URI. No abx indicated. He was given IM Decadron in the ED. Discharge with symptomatic tx for pain  Pt does not appear dehydrated, but did discuss importance of water rehydration. Presentation non concerning for PTA or infxn spread to soft tissue. No trismus or uvula deviation. Specific return precautions discussed. Pt able to drink water in ED without difficulty with intact air way. Recommended PCP  follow up. Pt verbalized understanding of and agreement with plan and is safe for discharge home at this time.    Final Clinical Impressions(s) / ED Diagnoses   Final diagnoses:  Sore throat  Dry cough    ED Discharge Orders    None       Bennye AlmFawze, Edee Nifong A, PA-C 08/01/17 1032    Linwood DibblesKnapp, Jon, MD 08/02/17 1627

## 2017-08-01 NOTE — Discharge Instructions (Signed)
Continue to stay well-hydrated. Gargle warm salt water and spit it out for sore throat. May also use cough drops, warm teas,over-the-counter numbing sprays such as Chloraseptic, etc. May take over-the-counter medications for your symptoms.. Alternate 600 mg of ibuprofen and [phone] mg of Tylenol every 3 hours as needed for pain. Do not exceed 4000 mg of Tylenol daily.   Followup with your primary care doctor in 5-7 days for recheck of ongoing symptoms. Return to emergency department for emergent changing or worsening of symptoms such as throat tightness, facial swelling, fever not controlled by ibuprofen or Tylenol,difficulty breathing, or chest pain.

## 2017-08-01 NOTE — ED Triage Notes (Signed)
Patient complains of worsening sore throat and congestion x 1 day, NAD

## 2018-01-02 ENCOUNTER — Emergency Department (HOSPITAL_COMMUNITY)
Admission: EM | Admit: 2018-01-02 | Discharge: 2018-01-02 | Disposition: A | Discharge status: Home / Self Care | Payer: Medicaid Other | Attending: Emergency Medicine | Admitting: Emergency Medicine

## 2018-01-02 ENCOUNTER — Encounter (HOSPITAL_COMMUNITY): Payer: Self-pay | Admitting: Emergency Medicine

## 2018-01-02 DIAGNOSIS — K047 Periapical abscess without sinus: Secondary | ICD-10-CM

## 2018-01-02 DIAGNOSIS — K0889 Other specified disorders of teeth and supporting structures: Secondary | ICD-10-CM | POA: Insufficient documentation

## 2018-01-02 MED ORDER — PENICILLIN V POTASSIUM 500 MG PO TABS: 500.0000 mg | ORAL_TABLET | Freq: Three times a day (TID) | ORAL | 0 refills | Status: DC

## 2018-01-02 MED ORDER — PENICILLIN V POTASSIUM 500 MG PO TABS
500.0000 mg | ORAL_TABLET | Freq: Once | ORAL | Status: AC
  Administered 2018-01-02: 500 mg via ORAL
  Filled 2018-01-02: qty 1

## 2018-01-02 NOTE — ED Provider Notes (Signed)
[facility]-EMERGENCY DEPT Provider Note   CSN: [id number] Arrival date & time: 01/02/18  1025     History   Chief Complaint Chief Complaint  Patient presents with  . Oral Swelling    HPI Jeffrey Oneal is a 27 y.o. male.  HPI Patient is a 27 year old male presents the emergency department with lower abdominal dental pain and swelling over the past 3 days.  He states today he has had purulent material coming out of the side of the tooth.  No difficulty breathing or swallowing.  Pain is mild to moderate in severity and worse with palpation of tooth #20.   Past Medical History:  Diagnosis Date  . Anxiety     There are no active problems to display for this patient.   History reviewed. No pertinent surgical history.      Home Medications    Prior to Admission medications   Medication Sig Start Date End Date Taking? Authorizing Provider  fluticasone (FLONASE) 50 MCG/ACT nasal spray Place 2 sprays into both nostrils daily. Patient not taking: Reported on 08/01/2017 06/03/17   Caccavale, Sophia, PA-C  methocarbamol (ROBAXIN) 500 MG tablet Take 1 tablet (500 mg total) by mouth 2 (two) times daily. Patient not taking: Reported on 08/01/2017 10/18/15   Audry Pili, PA-C  penicillin v potassium (VEETID) 500 MG tablet Take 1 tablet (500 mg total) by mouth 3 (three) times daily. 01/02/18   Azalia Bilis, MD    Family History No family history on file.  Social History Social History   Tobacco Use  . Smoking status: Light Tobacco Smoker    Types: Cigars  . Smokeless tobacco: Never Used  Substance Use Topics  . Alcohol use: Yes    Comment: occ  . Drug use: Yes    Frequency: 3.0 times per week    Types: Marijuana     Allergies   Patient has no known allergies.   Review of Systems Review of Systems  All other systems reviewed and are negative.    Physical Exam Updated Vital Signs BP 122/88 (BP Location: Left Arm)   Pulse 72   Temp 97.9 F  (36.6 C) (Oral)   Resp 16   SpO2 100%   Physical Exam  Constitutional: He is oriented to person, place, and time. He appears well-developed and well-nourished.  HENT:  Head: Normocephalic.  Gingival swelling and fluctuance of tooth #20 on the lingular surface.  Draining purulent material.  Space under his tongue is soft.  Anterior neck is normal.  Tolerating secretions.  Oral airway patent.  No trismus.  No stridor.  Eyes: EOM are normal.  Neck: Normal range of motion.  Pulmonary/Chest: Effort normal.  Abdominal: He exhibits no distension.  Musculoskeletal: Normal range of motion.  Neurological: He is alert and oriented to person, place, and time.  Psychiatric: He has a normal mood and affect.  Nursing note and vitals reviewed.    ED Treatments / Results  Labs (all labs ordered are listed, but only abnormal results are displayed) Labs Reviewed - No data to display  EKG None  Radiology No results found.  Procedures Procedures (including critical care time)  Medications Ordered in ED Medications  penicillin v potassium (VEETID) tablet 500 mg (has no administration in time range)     Initial Impression / Assessment and Plan / ED Course  I have reviewed the triage vital signs and the nursing notes.  Pertinent labs & imaging results that were available during my care of  the patient were reviewed by me and considered in my medical decision making (see chart for details).     Dental abscess and dental infection.  Able to manipulate the dental abscess and express additional purulent material.  This is freely draining abscess.  Home with oral antibiotics and instructions to follow-up with a dentist.  Patient understands return to the emergency department for new or worsening symptoms  Final Clinical Impressions(s) / ED Diagnoses   Final diagnoses:  Dental infection    ED Discharge Orders         Ordered    penicillin v potassium (VEETID) 500 MG tablet  3 times daily      01/02/18 1127           Azalia Bilis, MD 01/02/18 1130

## 2018-01-02 NOTE — ED Triage Notes (Signed)
pt c/o left lower dental swelling for 3 days.

## 2018-01-02 NOTE — Discharge Instructions (Addendum)
Please call a dentist for follow up °

## 2018-04-12 ENCOUNTER — Other Ambulatory Visit: Payer: Self-pay

## 2018-04-12 ENCOUNTER — Encounter (HOSPITAL_COMMUNITY): Payer: Self-pay

## 2018-04-12 ENCOUNTER — Emergency Department (HOSPITAL_COMMUNITY)
Admission: EM | Admit: 2018-04-12 | Discharge: 2018-04-12 | Disposition: A | Discharge status: Home / Self Care | Payer: Medicaid Other | Attending: Emergency Medicine | Admitting: Emergency Medicine

## 2018-04-12 DIAGNOSIS — G43009 Migraine without aura, not intractable, without status migrainosus: Secondary | ICD-10-CM | POA: Insufficient documentation

## 2018-04-12 DIAGNOSIS — Z87891 Personal history of nicotine dependence: Secondary | ICD-10-CM | POA: Insufficient documentation

## 2018-04-12 MED ORDER — METOCLOPRAMIDE HCL 5 MG/ML IJ SOLN
10.0000 mg | Freq: Once | INTRAMUSCULAR | Status: AC
  Administered 2018-04-12: 10 mg via INTRAMUSCULAR
  Filled 2018-04-12: qty 2

## 2018-04-12 MED ORDER — KETOROLAC TROMETHAMINE 60 MG/2ML IM SOLN
60.0000 mg | Freq: Once | INTRAMUSCULAR | Status: AC
  Administered 2018-04-12: 60 mg via INTRAMUSCULAR
  Filled 2018-04-12: qty 2

## 2018-04-12 NOTE — Discharge Instructions (Addendum)
Please follow-up with an optometrist for a thorough eye examination  Take ibuprofen and Tylenol to help with any recurrent headache.  Call the neurologist for follow-up as needed for recurrent migraine type headaches.  Drink lots of fluids.  Eat quality healthy food

## 2018-04-12 NOTE — ED Triage Notes (Signed)
Patient c/o headache x 3 days. Patient c/o sensitivity to light, but denies blurred vision or N/V.

## 2018-04-12 NOTE — ED Provider Notes (Signed)
[facility]-EMERGENCY DEPT Provider Note   CSN: [id number] Arrival date & time: 04/12/18  1130     History   Chief Complaint Chief Complaint  Patient presents with  . Headache    HPI Jeffrey Oneal is a 27 y.o. male.  HPI Patient is a 27 year old male who reports ongoing headache over the past 3 days with associated light sensitivity.  No blurred vision.  Denies head trauma.  Denies nausea vomiting.  Patient is tried over-the-counter medications without improvement in his symptoms.  He previously wore prescription lenses but is not seen optometrist in some time.  He is scheduled to see optometry sometime in late January 2020.  No other complaints at this time.  Denies weakness of his arms or legs.   Past Medical History:  Diagnosis Date  . Anxiety     There are no active problems to display for this patient.   History reviewed. No pertinent surgical history.      Home Medications    Prior to Admission medications   Medication Sig Start Date End Date Taking? Authorizing Provider  fluticasone (FLONASE) 50 MCG/ACT nasal spray Place 2 sprays into both nostrils daily. Patient not taking: Reported on 08/01/2017 06/03/17   Caccavale, Sophia, PA-C  methocarbamol (ROBAXIN) 500 MG tablet Take 1 tablet (500 mg total) by mouth 2 (two) times daily. Patient not taking: Reported on 08/01/2017 10/18/15   Audry PiliMohr, Tyler, PA-C  penicillin v potassium (VEETID) 500 MG tablet Take 1 tablet (500 mg total) by mouth 3 (three) times daily. Patient not taking: Reported on 04/12/2018 01/02/18   Azalia Bilisampos, Milo Solana, MD    Family History Family History  Problem Relation Age of Onset  . Heart failure Mother   . Healthy Father     Social History Social History   Tobacco Use  . Smoking status: Former Smoker    Types: Cigars  . Smokeless tobacco: Never Used  Substance Use Topics  . Alcohol use: Yes    Comment: occ  . Drug use: Not Currently    Frequency: 3.0 times per week   Types: Marijuana     Allergies   Patient has no known allergies.   Review of Systems Review of Systems  All other systems reviewed and are negative.    Physical Exam Updated Vital Signs BP 133/86 (BP Location: Right Arm)   Pulse 69   Temp (!) 97.5 F (36.4 C) (Oral)   Resp 18   Ht 6' 0.75" (1.848 m)   Wt 90.7 kg   SpO2 100%   BMI 26.57 kg/m   Physical Exam Vitals signs and nursing note reviewed.  Constitutional:      Appearance: He is well-developed.  HENT:     Head: Normocephalic and atraumatic.  Eyes:     Pupils: Pupils are equal, round, and reactive to light.  Neck:     Musculoskeletal: Normal range of motion.  Cardiovascular:     Rate and Rhythm: Normal rate and regular rhythm.     Heart sounds: Normal heart sounds.  Pulmonary:     Effort: Pulmonary effort is normal. No respiratory distress.     Breath sounds: Normal breath sounds.  Abdominal:     General: There is no distension.     Palpations: Abdomen is soft.     Tenderness: There is no abdominal tenderness.  Musculoskeletal: Normal range of motion.  Skin:    General: Skin is warm and dry.  Neurological:     Mental Status: He is  alert and oriented to person, place, and time.     Comments: 5/5 strength in major muscle groups of  bilateral upper and lower extremities. Speech normal. No facial asymetry.   Psychiatric:        Judgment: Judgment normal.      ED Treatments / Results  Labs (all labs ordered are listed, but only abnormal results are displayed) Labs Reviewed - No data to display  EKG None  Radiology No results found.  Procedures Procedures (including critical care time)  Medications Ordered in ED Medications  metoCLOPramide (REGLAN) injection 10 mg (10 mg Intramuscular Given 04/12/18 1249)  ketorolac (TORADOL) injection 60 mg (60 mg Intramuscular Given 04/12/18 1250)     Initial Impression / Assessment and Plan / ED Course  I have reviewed the triage vital signs and the  nursing notes.  Pertinent labs & imaging results that were available during my care of the patient were reviewed by me and considered in my medical decision making (see chart for details).     Patient symptoms improved in the emergency department.  Feels much better at this time.  Outpatient primary care follow-up.  Neurology if he gets any recurrent migraine type headaches.  He will need to follow-up with optometry for a full eye examination.  Patient encouraged to return to the emergency department for new or worsening symptoms.  No indication for advanced imaging.  Normal neurologic exam.  No focal deficits.  Final Clinical Impressions(s) / ED Diagnoses   Final diagnoses:  Migraine without aura and without status migrainosus, not intractable    ED Discharge Orders    None       Azalia Bilisampos, Franziska Podgurski, MD 04/12/18 1406

## 2018-04-12 NOTE — ED Notes (Signed)
Pt states that he is feeling much better and is headache is just about gone

## 2018-05-02 DIAGNOSIS — K648 Other hemorrhoids: Secondary | ICD-10-CM | POA: Diagnosis not present

## 2018-05-25 ENCOUNTER — Ambulatory Visit: Payer: Self-pay | Admitting: Family Medicine

## 2019-11-14 ENCOUNTER — Other Ambulatory Visit: Payer: Self-pay

## 2019-11-14 ENCOUNTER — Emergency Department (HOSPITAL_COMMUNITY)
Admission: EM | Admit: 2019-11-14 | Discharge: 2019-11-14 | Disposition: A | Discharge status: Home / Self Care | Payer: Medicaid Other | Attending: Emergency Medicine | Admitting: Emergency Medicine

## 2019-11-14 ENCOUNTER — Emergency Department (HOSPITAL_COMMUNITY): Payer: Medicaid Other

## 2019-11-14 ENCOUNTER — Encounter (HOSPITAL_COMMUNITY): Payer: Self-pay | Admitting: Emergency Medicine

## 2019-11-14 DIAGNOSIS — R0602 Shortness of breath: Secondary | ICD-10-CM | POA: Diagnosis not present

## 2019-11-14 DIAGNOSIS — Z5321 Procedure and treatment not carried out due to patient leaving prior to being seen by health care provider: Secondary | ICD-10-CM | POA: Insufficient documentation

## 2019-11-14 NOTE — ED Triage Notes (Signed)
Patient complaining of sob. Patient O2 Sat - 100%. Patient has a hx of anxiety. Patient states that he feels like he is not getting enough air and he has to take deep breath.

## 2019-11-15 ENCOUNTER — Telehealth: Payer: Self-pay | Admitting: *Deleted

## 2019-11-15 NOTE — Telephone Encounter (Signed)
Jeffrey Oneal presented to the ED and left before being seen by the provider on 11/14/19. The patient has been enrolled in an automated general discharge outreach program and 2 attempts to contact the patient will be made to follow up on their ED visit and subsequent needs. The care management team is available to provide assistance to this patient at any time.   Burnard Bunting, RN, BSN, CCRN [facility] [phone]

## 2020-05-14 ENCOUNTER — Other Ambulatory Visit: Payer: Self-pay

## 2020-05-14 ENCOUNTER — Ambulatory Visit (HOSPITAL_COMMUNITY)
Admission: EM | Admit: 2020-05-14 | Discharge: 2020-05-14 | Disposition: A | Discharge status: Home / Self Care | Payer: Medicaid Other | Attending: Emergency Medicine | Admitting: Emergency Medicine

## 2020-05-14 ENCOUNTER — Encounter (HOSPITAL_COMMUNITY): Payer: Self-pay

## 2020-05-14 DIAGNOSIS — R509 Fever, unspecified: Secondary | ICD-10-CM | POA: Insufficient documentation

## 2020-05-14 DIAGNOSIS — R42 Dizziness and giddiness: Secondary | ICD-10-CM | POA: Diagnosis not present

## 2020-05-14 DIAGNOSIS — B349 Viral infection, unspecified: Secondary | ICD-10-CM

## 2020-05-14 DIAGNOSIS — R519 Headache, unspecified: Secondary | ICD-10-CM | POA: Diagnosis not present

## 2020-05-14 DIAGNOSIS — U071 COVID-19: Secondary | ICD-10-CM | POA: Insufficient documentation

## 2020-05-14 DIAGNOSIS — R5383 Other fatigue: Secondary | ICD-10-CM | POA: Diagnosis not present

## 2020-05-14 DIAGNOSIS — Z87891 Personal history of nicotine dependence: Secondary | ICD-10-CM | POA: Diagnosis not present

## 2020-05-14 LAB — SARS CORONAVIRUS 2 (TAT 6-24 HRS): SARS Coronavirus 2: POSITIVE — AB

## 2020-05-14 NOTE — Discharge Instructions (Addendum)
Your COVID test is pending.  You should self quarantine until the test result is back.    Take Tylenol or ibuprofen as needed for fever or discomfort.  Rest and keep yourself hydrated.    Follow-up with your primary care provider if your symptoms are not improving.     

## 2020-05-14 NOTE — ED Provider Notes (Signed)
[facility]    CSN: [id number] Arrival date & time: 05/14/20  1037      History   Chief Complaint Chief Complaint  Patient presents with  . Fever    HPI Jeffrey Oneal is a 30 y.o. male.   Patient presents with 4 to 5-day history of body aches, headache, fatigue, dizziness.  He states he had a fever for the first 2 days but this is resolved.  He denies rash, chest pain, shortness of breath, abdominal pain, vomiting, diarrhea, or other symptoms.  Treatment attempted at home with Excedrin and zinc.  His medical history includes anxiety.  The history is provided by the patient and medical records.    Past Medical History:  Diagnosis Date  . Anxiety     There are no problems to display for this patient.   History reviewed. No pertinent surgical history.     Home Medications    Prior to Admission medications   Medication Sig Start Date End Date Taking? Authorizing Provider  fluticasone (FLONASE) 50 MCG/ACT nasal spray Place 2 sprays into both nostrils daily. Patient not taking: No sig reported 06/03/17   Caccavale, Sophia, PA-C  methocarbamol (ROBAXIN) 500 MG tablet Take 1 tablet (500 mg total) by mouth 2 (two) times daily. Patient not taking: No sig reported 10/18/15   Audry Pili, PA-C  penicillin v potassium (VEETID) 500 MG tablet Take 1 tablet (500 mg total) by mouth 3 (three) times daily. Patient not taking: No sig reported 01/02/18   Azalia Bilis, MD    Family History Family History  Problem Relation Age of Onset  . Heart failure Mother   . Healthy Father     Social History Social History   Tobacco Use  . Smoking status: Former Smoker    Types: Cigars  . Smokeless tobacco: Never Used  Vaping Use  . Vaping Use: Never used  Substance Use Topics  . Alcohol use: Yes    Comment: occ  . Drug use: Not Currently    Frequency: 3.0 times per week    Types: Marijuana     Allergies   Patient has no known allergies.   Review of Systems Review  of Systems  Constitutional: Positive for fatigue and fever. Negative for chills.  HENT: Negative for ear pain and sore throat.   Eyes: Negative for pain and visual disturbance.  Respiratory: Negative for cough and shortness of breath.   Cardiovascular: Negative for chest pain and palpitations.  Gastrointestinal: Negative for abdominal pain, diarrhea and vomiting.  Genitourinary: Negative for dysuria and hematuria.  Musculoskeletal: Negative for arthralgias and back pain.  Skin: Negative for color change and rash.  Neurological: Positive for dizziness and headaches. Negative for syncope, weakness and numbness.  All other systems reviewed and are negative.    Physical Exam Triage Vital Signs ED Triage Vitals  Enc Vitals Group     BP      Pulse      Resp      Temp      Temp src      SpO2      Weight      Height      Head Circumference      Peak Flow      Pain Score      Pain Loc      Pain Edu?      Excl. in GC?    No data found.  Updated Vital Signs BP 113/76 (BP Location: Right Arm)  Pulse 81   Temp 99.7 F (37.6 C) (Oral)   Resp 16   Wt 205 lb (93 kg)   SpO2 100%   BMI 27.05 kg/m   Visual Acuity Right Eye Distance:   Left Eye Distance:   Bilateral Distance:    Right Eye Near:   Left Eye Near:    Bilateral Near:     Physical Exam Vitals and nursing note reviewed.  Constitutional:      General: He is not in acute distress.    Appearance: He is well-developed and well-nourished. He is not ill-appearing.  HENT:     Head: Normocephalic and atraumatic.     Right Ear: Tympanic membrane normal.     Left Ear: Tympanic membrane normal.     Nose: Nose normal.     Mouth/Throat:     Mouth: Mucous membranes are moist.     Pharynx: Oropharynx is clear.  Eyes:     Conjunctiva/sclera: Conjunctivae normal.  Cardiovascular:     Rate and Rhythm: Normal rate and regular rhythm.     Heart sounds: Normal heart sounds.  Pulmonary:     Effort: Pulmonary effort is  normal. No respiratory distress.     Breath sounds: Normal breath sounds.  Abdominal:     Palpations: Abdomen is soft.     Tenderness: There is no abdominal tenderness. There is no guarding or rebound.  Musculoskeletal:        General: No edema.     Cervical back: Neck supple.  Skin:    General: Skin is warm and dry.     Findings: No rash.  Neurological:     General: No focal deficit present.     Mental Status: He is alert and oriented to person, place, and time.     Gait: Gait normal.  Psychiatric:        Mood and Affect: Mood and affect and mood normal.        Behavior: Behavior normal.      UC Treatments / Results  Labs (all labs ordered are listed, but only abnormal results are displayed) Labs Reviewed - No data to display  EKG   Radiology No results found.  Procedures Procedures (including critical care time)  Medications Ordered in UC Medications - No data to display  Initial Impression / Assessment and Plan / UC Course  I have reviewed the triage vital signs and the nursing notes.  Pertinent labs & imaging results that were available during my care of the patient were reviewed by me and considered in my medical decision making (see chart for details).   Viral illness. COVID pending.  Instructed patient to self quarantine until the test results are back.  Discussed symptomatic treatment including Tylenol, rest, hydration.  Instructed patient to follow up with PCP if his symptoms are not improving.  Patient agrees to plan of care.    Final Clinical Impressions(s) / UC Diagnoses   Final diagnoses:  Viral illness     Discharge Instructions     Your COVID test is pending.  You should self quarantine until the test result is back.    Take Tylenol or ibuprofen as needed for fever or discomfort.  Rest and keep yourself hydrated.    Follow-up with your primary care provider if your symptoms are not improving.        ED Prescriptions    None      PDMP not reviewed this encounter.   Mickie Bail, NP 05/14/20 [phone]

## 2020-05-14 NOTE — ED Triage Notes (Signed)
Patient presents to Urgent Care with complaints of Migraine, felt warm, fatigue, dizziness, body aches since Sunday. Pt states symptoms have not improved. Pt treating with Excedrin and zinc.   Denies abdominal pain.

## 2020-05-15 ENCOUNTER — Telehealth: Payer: Self-pay

## 2020-05-15 NOTE — Telephone Encounter (Signed)
Called to discuss with patient about COVID-19 symptoms and the use of one of the available treatments for those with mild to moderate Covid symptoms and at a high risk of hospitalization.  Pt appears to qualify for outpatient treatment due to co-morbid conditions and/or a member of an at-risk group in accordance with the FDA Emergency Use Authorization.    Symptom onset: 05/09/20 per chart Vaccinated: Unknown Booster? Unknown Immunocompromised? No Qualifiers: None  Unable to reach pt - Left message.   Esther Hardy

## 2020-06-10 ENCOUNTER — Emergency Department (HOSPITAL_COMMUNITY)
Admission: EM | Admit: 2020-06-10 | Discharge: 2020-06-10 | Disposition: A | Discharge status: Home / Self Care | Payer: Medicaid Other | Attending: Emergency Medicine | Admitting: Emergency Medicine

## 2020-06-10 ENCOUNTER — Other Ambulatory Visit: Payer: Self-pay

## 2020-06-10 ENCOUNTER — Encounter (HOSPITAL_COMMUNITY): Payer: Self-pay | Admitting: Emergency Medicine

## 2020-06-10 DIAGNOSIS — F419 Anxiety disorder, unspecified: Secondary | ICD-10-CM | POA: Insufficient documentation

## 2020-06-10 DIAGNOSIS — R42 Dizziness and giddiness: Secondary | ICD-10-CM | POA: Diagnosis not present

## 2020-06-10 DIAGNOSIS — Z87891 Personal history of nicotine dependence: Secondary | ICD-10-CM | POA: Insufficient documentation

## 2020-06-10 DIAGNOSIS — F410 Panic disorder [episodic paroxysmal anxiety] without agoraphobia: Secondary | ICD-10-CM

## 2020-06-10 MED ORDER — LORAZEPAM 0.5 MG PO TABS
0.5000 mg | ORAL_TABLET | Freq: Once | ORAL | Status: AC
  Administered 2020-06-10: 0.5 mg via ORAL
  Filled 2020-06-10: qty 1

## 2020-06-10 MED ORDER — HYDROXYZINE HCL 25 MG PO TABS: 25.0000 mg | ORAL_TABLET | Freq: Four times a day (QID) | ORAL | 0 refills | Status: DC | PRN

## 2020-06-10 NOTE — Discharge Instructions (Signed)
You are seen today for panic attack, as we discussed you may need to start medications for anxiety.  I did prescribe you some Atarax that you can start taking for anxiety, this is a medication that I want you to take as needed, it is not something that you need to take every day.  I attached some instructions on how to manage anxiety and more about panic attacks.  You can get certain apps such as calm which can help you with your breathing when these occur.  I want you to get a PCP, you can use the following instructions on how to do this or he can follow-up with [facility].  If you have any new or worsening concerning symptoms please come back to the emergency department.  Please speak to your pharmacist about any medications prescribed today in regards side effects or interactions with other medications.

## 2020-06-10 NOTE — ED Triage Notes (Signed)
Patient states he has struggled with his anxiety and palpitations recently, but today he had low stress. He was sitting in a chair at work and felt his heart beat really hard once and got a hot flash, told a co-worker he felt like he was going to pass out. Denies LOC, states he feels much better now.

## 2020-06-10 NOTE — ED Provider Notes (Signed)
[facility]-EMERGENCY DEPT Provider Note   CSN: [id number] Arrival date & time: 06/10/20  1244     History Chief Complaint  Patient presents with  . Anxiety    Jeffrey Oneal is a 30 y.o. male with past medical history of anxiety that presents the emerge department today for panic attack.  Patient states that he was sitting in a chair at work, felt as if he was having palpitations, got very hot, had some dizziness, told his coworker that he was about to pass out.  States that this lasted about 15 minutes, now states that most of his symptoms have resolved, however he still feels very anxious.  He states that he felt anxious recently for the past couple months here, states that his anxiety started 3 years ago after his mother had died.  Is not anything for his anxiety.  States that he did not have a trigger today.  Did not pass out, no LOC.  States that his last panic attack was in June of last year, feels similar.  Patient states that he is concerned about his heart, wants an EKG since he was having this palpitations.  Not currently having palpitations.  No chest pain or shortness of breath.  Denies any headache, vision changes, neck pain, abdominal pain, vomiting.  States that he was in his normal health before this.  Does not have a PCP.  No other complaints at this time. HPI     Past Medical History:  Diagnosis Date  . Anxiety     There are no problems to display for this patient.   History reviewed. No pertinent surgical history.     Family History  Problem Relation Age of Onset  . Heart failure Mother   . Healthy Father     Social History   Tobacco Use  . Smoking status: Former Smoker    Types: Cigars  . Smokeless tobacco: Never Used  Vaping Use  . Vaping Use: Never used  Substance Use Topics  . Alcohol use: Yes    Comment: occ  . Drug use: Not Currently    Frequency: 3.0 times per week    Types: Marijuana    Home Medications Prior to  Admission medications   Medication Sig Start Date End Date Taking? Authorizing Provider  hydrOXYzine (ATARAX/VISTARIL) 25 MG tablet Take 1 tablet (25 mg total) by mouth every 6 (six) hours as needed for anxiety. 06/10/20  Yes Suleiman Finigan, PA-C  fluticasone (FLONASE) 50 MCG/ACT nasal spray Place 2 sprays into both nostrils daily. Patient not taking: No sig reported 06/03/17   Caccavale, Sophia, PA-C  methocarbamol (ROBAXIN) 500 MG tablet Take 1 tablet (500 mg total) by mouth 2 (two) times daily. Patient not taking: No sig reported 10/18/15   Audry Pili, PA-C  penicillin v potassium (VEETID) 500 MG tablet Take 1 tablet (500 mg total) by mouth 3 (three) times daily. Patient not taking: No sig reported 01/02/18   Azalia Bilis, MD    Allergies    Patient has no known allergies.  Review of Systems   Review of Systems  Constitutional: Negative for chills, diaphoresis, fatigue and fever.  HENT: Negative for congestion, sore throat and trouble swallowing.   Eyes: Negative for pain and visual disturbance.  Respiratory: Negative for cough, shortness of breath and wheezing.   Cardiovascular: Positive for palpitations. Negative for chest pain and leg swelling.  Gastrointestinal: Negative for abdominal distention, abdominal pain, diarrhea, nausea and vomiting.  Genitourinary: Negative for difficulty  urinating.  Musculoskeletal: Negative for back pain, neck pain and neck stiffness.  Skin: Negative for pallor.  Neurological: Positive for dizziness. Negative for speech difficulty, weakness and headaches.  Psychiatric/Behavioral: Negative for confusion.    Physical Exam Updated Vital Signs BP (!) 134/99 (BP Location: Right Arm)   Pulse 72   Temp 98.2 F (36.8 C) (Oral)   Resp 18   Ht 6\' 1"  (1.854 m)   Wt 93 kg   SpO2 98%   BMI 27.05 kg/m   Physical Exam Constitutional:      General: He is not in acute distress.    Appearance: Normal appearance. He is not ill-appearing, toxic-appearing or  diaphoretic.  HENT:     Head: Normocephalic and atraumatic.     Mouth/Throat:     Mouth: Mucous membranes are moist.     Pharynx: Oropharynx is clear.  Eyes:     General: No scleral icterus.    Extraocular Movements: Extraocular movements intact.     Pupils: Pupils are equal, round, and reactive to light.  Cardiovascular:     Rate and Rhythm: Normal rate and regular rhythm.     Pulses: Normal pulses.     Heart sounds: Normal heart sounds.  Pulmonary:     Effort: Pulmonary effort is normal. No respiratory distress.     Breath sounds: Normal breath sounds. No stridor. No wheezing, rhonchi or rales.  Chest:     Chest wall: No tenderness.  Abdominal:     General: Abdomen is flat. There is no distension.     Palpations: Abdomen is soft.     Tenderness: There is no abdominal tenderness. There is no guarding or rebound.  Musculoskeletal:        General: No swelling or tenderness. Normal range of motion.     Cervical back: Normal range of motion and neck supple. No rigidity.     Right lower leg: No edema.     Left lower leg: No edema.  Skin:    General: Skin is warm and dry.     Capillary Refill: Capillary refill takes less than 2 seconds.     Coloration: Skin is not pale.  Neurological:     General: No focal deficit present.     Mental Status: He is alert and oriented to person, place, and time.     Gait: Gait normal.     Comments: Alert. Clear speech. No facial droop. CNIII-XII grossly intact. Bilateral upper and lower extremities' sensation grossly intact. 5/5 symmetric strength with grip strength and with plantar and dorsi flexion bilaterally.   Psychiatric:     Comments: Patient is very anxious     ED Results / Procedures / Treatments   Labs (all labs ordered are listed, but only abnormal results are displayed) Labs Reviewed - No data to display  EKG EKG Interpretation  Date/Time:  Wednesday June 10 2020 13:55:25 EST Ventricular Rate:  84 PR Interval:    QRS  Duration: 94 QT Interval:  360 QTC Calculation: 426 R Axis:   74 Text Interpretation: Sinus rhythm Borderline T wave abnormalities No acute changes No significant change since last tracing Confirmed by [id number] [phone]) on 06/10/2020 2:54:40 PM   Radiology No results found.  Procedures Procedures   Medications Ordered in ED Medications  LORazepam (ATIVAN) tablet 0.5 mg (0.5 mg Oral Given 06/10/20 1351)    ED Course  I have reviewed the triage vital signs and the nursing notes.  Pertinent labs & imaging results that  were available during my care of the patient were reviewed by me and considered in my medical decision making (see chart for details).    MDM Rules/Calculators/A&P                          Ricard Faulkner is a 30 y.o. male with past medical history of anxiety that presents the emerge department today for panic attack.  Patient appears well currently, normal neuro exam.  Patient very anxious home speaking to him, will give Ativan and reevaluate.  Upon reevaluation, patient states that he feels much better with Ativan.  Patient states that he feels as if his nerves are calm down.  States that he definitely feels as if he had a panic attack.  Does not resources for this, symptomatic treatment and resources provided.  Doubt need for further emergent work up at this time. I explained the diagnosis and have given explicit precautions to return to the ER including for any other new or worsening symptoms. The patient understands and accepts the medical plan as it's been dictated and I have answered their questions. Discharge instructions concerning home care and prescriptions have been given. The patient is STABLE and is discharged to home in good condition.   Final Clinical Impression(s) / ED Diagnoses Final diagnoses:  Anxiety attack    Rx / DC Orders ED Discharge Orders         Ordered    hydrOXYzine (ATARAX/VISTARIL) 25 MG tablet  Every 6 hours PRN         06/10/20 1507           Farrel Gordon, PA-C 06/10/20 1511    Derwood Kaplan, MD 06/10/20 [phone]

## 2020-08-18 ENCOUNTER — Emergency Department (HOSPITAL_COMMUNITY)
Admission: EM | Admit: 2020-08-18 | Discharge: 2020-08-18 | Disposition: A | Discharge status: Home / Self Care | Payer: Medicaid Other | Attending: Emergency Medicine | Admitting: Emergency Medicine

## 2020-08-18 ENCOUNTER — Other Ambulatory Visit: Payer: Self-pay

## 2020-08-18 ENCOUNTER — Encounter (HOSPITAL_COMMUNITY): Payer: Self-pay | Admitting: *Deleted

## 2020-08-18 DIAGNOSIS — Z87891 Personal history of nicotine dependence: Secondary | ICD-10-CM | POA: Insufficient documentation

## 2020-08-18 DIAGNOSIS — R000 Tachycardia, unspecified: Secondary | ICD-10-CM | POA: Insufficient documentation

## 2020-08-18 DIAGNOSIS — R42 Dizziness and giddiness: Secondary | ICD-10-CM | POA: Diagnosis present

## 2020-08-18 DIAGNOSIS — I10 Essential (primary) hypertension: Secondary | ICD-10-CM

## 2020-08-18 DIAGNOSIS — F419 Anxiety disorder, unspecified: Secondary | ICD-10-CM

## 2020-08-18 LAB — CBC WITH DIFFERENTIAL/PLATELET
Abs Immature Granulocytes: 0.01 10*3/uL (ref 0.00–0.07)
Basophils Absolute: 0 10*3/uL (ref 0.0–0.1)
Basophils Relative: 1 %
Eosinophils Absolute: 0.2 10*3/uL (ref 0.0–0.5)
Eosinophils Relative: 2 %
HCT: 45.5 % (ref 39.0–52.0)
Hemoglobin: 14.9 g/dL (ref 13.0–17.0)
Immature Granulocytes: 0 %
Lymphocytes Relative: 35 %
Lymphs Abs: 2.8 10*3/uL (ref 0.7–4.0)
MCH: 31.8 pg (ref 26.0–34.0)
MCHC: 32.7 g/dL (ref 30.0–36.0)
MCV: 97.2 fL (ref 80.0–100.0)
Monocytes Absolute: 0.8 10*3/uL (ref 0.1–1.0)
Monocytes Relative: 10 %
Neutro Abs: 4.3 10*3/uL (ref 1.7–7.7)
Neutrophils Relative %: 52 %
Platelets: 241 10*3/uL (ref 150–400)
RBC: 4.68 MIL/uL (ref 4.22–5.81)
RDW: 11.9 % (ref 11.5–15.5)
WBC: 8.1 10*3/uL (ref 4.0–10.5)
nRBC: 0 % (ref 0.0–0.2)

## 2020-08-18 LAB — COMPREHENSIVE METABOLIC PANEL
ALT: 16 U/L (ref 0–44)
AST: 32 U/L (ref 15–41)
Albumin: 4.8 g/dL (ref 3.5–5.0)
Alkaline Phosphatase: 50 U/L (ref 38–126)
Anion gap: 9 (ref 5–15)
BUN: 9 mg/dL (ref 6–20)
CO2: 25 mmol/L (ref 22–32)
Calcium: 9.5 mg/dL (ref 8.9–10.3)
Chloride: 103 mmol/L (ref 98–111)
Creatinine, Ser: 0.99 mg/dL (ref 0.61–1.24)
GFR, Estimated: >60 mL/min (ref >60)
Glucose, Bld: 126 mg/dL — ABNORMAL HIGH (ref 70–99)
Potassium: 3.2 mmol/L — ABNORMAL LOW (ref 3.5–5.1)
Sodium: 137 mmol/L (ref 135–145)
Total Bilirubin: 1.1 mg/dL (ref 0.3–1.2)
Total Protein: 8.4 g/dL — ABNORMAL HIGH (ref 6.5–8.1)

## 2020-08-18 LAB — URINALYSIS, ROUTINE W REFLEX MICROSCOPIC
Bilirubin Urine: NEGATIVE
Glucose, UA: NEGATIVE mg/dL
Hgb urine dipstick: NEGATIVE
Ketones, ur: NEGATIVE mg/dL
Leukocytes,Ua: NEGATIVE
Nitrite: NEGATIVE
Protein, ur: NEGATIVE mg/dL
Specific Gravity, Urine: 1.017 (ref 1.005–1.030)
pH: 7 (ref 5.0–8.0)

## 2020-08-18 LAB — RAPID URINE DRUG SCREEN, HOSP PERFORMED
Amphetamines: NOT DETECTED
Barbiturates: NOT DETECTED
Benzodiazepines: NOT DETECTED
Cocaine: NOT DETECTED
Opiates: NOT DETECTED
Tetrahydrocannabinol: NOT DETECTED

## 2020-08-18 LAB — PROTIME-INR
INR: 1 (ref 0.8–1.2)
Prothrombin Time: 13.4 seconds (ref 11.4–15.2)

## 2020-08-18 LAB — TSH: TSH: 2.355 u[IU]/mL (ref 0.350–4.500)

## 2020-08-18 LAB — TROPONIN I (HIGH SENSITIVITY)
Troponin I (High Sensitivity): 2 ng/L (ref <18)
Troponin I (High Sensitivity): <2 ng/L (ref <18)

## 2020-08-18 MED ORDER — METOPROLOL TARTRATE 25 MG PO TABS: 12.5000 mg | ORAL_TABLET | Freq: Two times a day (BID) | ORAL | 0 refills | Status: DC

## 2020-08-18 MED ORDER — HYDROXYZINE PAMOATE 25 MG PO CAPS: 25.0000 mg | ORAL_CAPSULE | Freq: Four times a day (QID) | ORAL | 0 refills | Status: DC | PRN

## 2020-08-18 MED ORDER — METOPROLOL TARTRATE 25 MG PO TABS
25.0000 mg | ORAL_TABLET | Freq: Once | ORAL | Status: AC
  Administered 2020-08-18: 25 mg via ORAL
  Filled 2020-08-18: qty 1

## 2020-08-18 MED ORDER — HYDROXYZINE HCL 25 MG PO TABS
50.0000 mg | ORAL_TABLET | Freq: Once | ORAL | Status: AC
  Administered 2020-08-18: 50 mg via ORAL
  Filled 2020-08-18: qty 2

## 2020-08-18 MED ORDER — CLONIDINE HCL 0.1 MG PO TABS
0.1000 mg | ORAL_TABLET | Freq: Once | ORAL | Status: AC
  Administered 2020-08-18: 0.1 mg via ORAL
  Filled 2020-08-18: qty 1

## 2020-08-18 NOTE — ED Notes (Addendum)
Pt's heart rate increased to 167 after telling him his blood pressure was 180/102. Heart rate returned to 90 after taking deep breaths. EKG sinus rhythm. MD Pfeiffer notified and given EKG

## 2020-08-18 NOTE — ED Notes (Signed)
Provided pt with a sandwich and drink

## 2020-08-18 NOTE — ED Provider Notes (Signed)
[facility]-EMERGENCY DEPT Provider Note   CSN: [id number] Arrival date & time: 08/18/20  0801     History Chief Complaint  Patient presents with  . Dizziness  . Tingling    Jeffrey Oneal is a 30 y.o. male.  HPI Patient reports he has had increased blood pressure and heart rate over the past several days.  Patient does not have prior history of hypertension but does have familial hypertension with early onset in his sister.  He reports symptoms became most notable with an anxiety provoking event.  He does not specify a particular event but reports that he is getting married and he is looking for some job changes and it seemed like it really got triggered by cumulative stress.  He reports he really just felt like his blood pressure was elevated and experienced a feeling of lightheadedness and intermittently tingling and numb feeling in both hands and occasionally his face.  No syncopal episodes, no severe headache, no visual changes.  He reports when he feels like his heart is racing he does get a perception of his chest being tighter but not pain.  Patient denies any drug use, no significant alcohol use no current smoking.  Patient reports at baseline he is pretty active and works out, he does not experience chest pain or exertional short of breath with working out.    Past Medical History:  Diagnosis Date  . Anxiety     There are no problems to display for this patient.   History reviewed. No pertinent surgical history.     Family History  Problem Relation Age of Onset  . Heart failure Mother   . Healthy Father     Social History   Tobacco Use  . Smoking status: Former Smoker    Types: Cigars  . Smokeless tobacco: Never Used  Vaping Use  . Vaping Use: Never used  Substance Use Topics  . Alcohol use: Yes    Comment: occ  . Drug use: Not Currently    Frequency: 3.0 times per week    Types: Marijuana    Home Medications Prior to Admission  medications   Medication Sig Start Date End Date Taking? Authorizing Provider  hydrOXYzine (VISTARIL) 25 MG capsule Take 1 capsule (25 mg total) by mouth every 6 (six) hours as needed for anxiety. 08/18/20  Yes Arby Barrette, MD  metoprolol tartrate (LOPRESSOR) 25 MG tablet Take 0.5 tablets (12.5 mg total) by mouth 2 (two) times daily. 08/18/20  Yes Arby Barrette, MD  OVER THE COUNTER MEDICATION Take 2 capsules by mouth daily. Seamoss & Burdock root 500mg    Yes [provider]  fluticasone (FLONASE) 50 MCG/ACT nasal spray Place 2 sprays into both nostrils daily. Patient not taking: No sig reported 06/03/17   Caccavale, Sophia, PA-C  hydrOXYzine (ATARAX/VISTARIL) 25 MG tablet Take 1 tablet (25 mg total) by mouth every 6 (six) hours as needed for anxiety. Patient not taking: Reported on 08/18/2020 06/10/20   06/12/20, PA-C  methocarbamol (ROBAXIN) 500 MG tablet Take 1 tablet (500 mg total) by mouth 2 (two) times daily. Patient not taking: No sig reported 10/18/15   10/20/15, PA-C  penicillin v potassium (VEETID) 500 MG tablet Take 1 tablet (500 mg total) by mouth 3 (three) times daily. Patient not taking: No sig reported 01/02/18   03/04/18, MD    Allergies    Patient has no known allergies.  Review of Systems   Review of Systems 10 systems reviewed  and negative except as per HPI Physical Exam Updated Vital Signs BP (!) 125/91   Pulse 98   Temp 98.6 F (37 C)   Resp 11   SpO2 98%   Physical Exam Constitutional:      Appearance: He is well-developed.     Comments: Well-nourished well-developed.  Patient is anxious in appearance and hyperventilating slightly.  HENT:     Head: Normocephalic and atraumatic.     Mouth/Throat:     Mouth: Mucous membranes are moist.     Pharynx: Oropharynx is clear.  Eyes:     Extraocular Movements: Extraocular movements intact.     Conjunctiva/sclera: Conjunctivae normal.     Pupils: Pupils are equal, round, and reactive to light.   Cardiovascular:     Rate and Rhythm: Normal rate and regular rhythm.     Heart sounds: Normal heart sounds.  Pulmonary:     Effort: Pulmonary effort is normal.     Breath sounds: Normal breath sounds.  Abdominal:     General: Bowel sounds are normal. There is no distension.     Palpations: Abdomen is soft.     Tenderness: There is no abdominal tenderness.  Musculoskeletal:        General: Normal range of motion.     Cervical back: Neck supple.  Skin:    General: Skin is warm and dry.  Neurological:     General: No focal deficit present.     Mental Status: He is alert and oriented to person, place, and time.     GCS: GCS eye subscore is 4. GCS verbal subscore is 5. GCS motor subscore is 6.     Cranial Nerves: No cranial nerve deficit.     Sensory: No sensory deficit.     Motor: No weakness.     Coordination: Coordination normal.  Psychiatric:     Comments: Patient has mildly anxious appearance.  He does have good eye contact.     ED Results / Procedures / Treatments   Labs (all labs ordered are listed, but only abnormal results are displayed) Labs Reviewed  COMPREHENSIVE METABOLIC PANEL - Abnormal; Notable for the following components:      Result Value   Potassium 3.2 (*)    Glucose, Bld 126 (*)    Total Protein 8.4 (*)    All other components within normal limits  CBC WITH DIFFERENTIAL/PLATELET  PROTIME-INR  URINALYSIS, ROUTINE W REFLEX MICROSCOPIC  RAPID URINE DRUG SCREEN, HOSP PERFORMED  TSH  TROPONIN I (HIGH SENSITIVITY)  TROPONIN I (HIGH SENSITIVITY)    EKG EKG Interpretation  Date/Time:  Tuesday August 18 2020 08:12:34 EDT Ventricular Rate:  121 PR Interval:  161 QRS Duration: 92 QT Interval:  317 QTC Calculation: 450 R Axis:   78 Text Interpretation: Sinus tachycardia agree, no change from previous except increase heart rate. Confirmed by Arby Barrette [phone]) on 08/18/2020 10:49:57 AM   Radiology No results found.  Procedures Procedures    Medications Ordered in ED Medications  hydrOXYzine (ATARAX/VISTARIL) tablet 50 mg (50 mg Oral Given 08/18/20 0847)  cloNIDine (CATAPRES) tablet 0.1 mg (0.1 mg Oral Given 08/18/20 0848)  metoprolol tartrate (LOPRESSOR) tablet 25 mg (25 mg Oral Given 08/18/20 1134)    ED Course  I have reviewed the triage vital signs and the nursing notes.  Pertinent labs & imaging results that were available during my care of the patient were reviewed by me and considered in my medical decision making (see chart for details).  MDM Rules/Calculators/A&P                         Patient presents as outlined above.  EKG does not show acute ischemic changes and troponins are normal.  Patient has low risk factors for ACS at this point.  I do suspect some baseline essential hypertension.  Patient's sister has hypertension at fairly young age and patient's diastolic pressures have remained consistently elevated.  Patient's heart rate has been quite variable but sinus rhythm.  EKG does not show any acute changes from previous.  I could observe the patient resting quietly in the room with a heart rate in the 80s and upon coming into the room and starting to talk to him heart rate could go up as high as 150s.  It seemed to be very responsive to patient's anxiety response.  At times he would hyperventilate slightly and was visibly trying to control his breathing with some slow exhaling.  I suspect symptoms of numbness and tingling are secondary to hyperventilation.  Patient's potassium was just of the mild low side.  Patient does not have any PE or DVT risk factors.  He has no peripheral swelling or calf pain and no pleuritic chest pain.  Patient was given clonidine and Atarax with improved symptoms.  Blood pressure normalized but diastolic blood pressure remained at 90.  Heart rate came down to consistently be in the 80s or 90s so long as the patient was not talking about his symptoms.  At this time plan will be to start a  low-dose of Lopressor for suspected essential hypertension and sinus tachycardia.  We will also prescribe Atarax for periods of more intensely perceived stress or anxiety.  I have counseled the patient on extreme importance of close follow-up for his response these medications, monitoring his blood pressures and immediate return with any worsening or changing of symptoms.  Patient reports that his fiance was arranging a PCP follow-up.  He is advised that he can also find follow-up through the discharge instructions. Final Clinical Impression(s) / ED Diagnoses Final diagnoses:  Essential hypertension  Anxiety  Sinus tachycardia    Rx / DC Orders ED Discharge Orders         Ordered    metoprolol tartrate (LOPRESSOR) 25 MG tablet  2 times daily        08/18/20 1243    hydrOXYzine (VISTARIL) 25 MG capsule  Every 6 hours PRN        08/18/20 1243           Arby Barrette, MD 08/20/20 [phone]

## 2020-08-18 NOTE — ED Triage Notes (Signed)
Pt complains of lightheadedness, numbness in both hands, and tingling in his left face and left arm for the past 2.5 days. He first noticed it when he was getting stressed out. He denies pain. He felt like his blood pressure was high but did not take it.

## 2020-08-18 NOTE — ED Notes (Signed)
Pt in room speaking with provider.

## 2020-08-18 NOTE — Discharge Instructions (Signed)
1.  Is very important that you follow-up with your family doctor within the next 4 to 7 days. 2.  Start taking metoprolol as prescribed.  You are to start with 1/2 tablet twice daily.  Monitor your heart rate and your blood pressure and write down the numbers 3-4 times a day for the next 5 days.  If your heart rate is staying greater than 90 and your blood pressure is staying greater than 130/90, increase the metoprolol dose to a full tablet in the morning and continue to monitor your values.. 3.  You have been given a medication called Vistaril to help with anxiety.  Medication can be slightly sedating.  May take a tablet every 6 hours if needed for anxiety.  It is important that you get established with a counselor to further evaluate your symptoms.  May be other medications that are helpful if needed.  Sometimes talk therapy is all that is needed to identify triggers and resolve the issues no long-term medications are needed. 4.  Return to the emergency department if your symptoms are persisting, worsening or new concerning symptoms develop.

## 2020-08-19 ENCOUNTER — Telehealth: Payer: Self-pay

## 2020-08-19 NOTE — Telephone Encounter (Signed)
Transition Care Management Follow-up Telephone Call  Date of discharge and from where: 08/18/2020 from [facility]  How have you been since you were released from the hospital? Pt stated that he is feeling much better today.   Any questions or concerns? No  Items Reviewed:  Did the pt receive and understand the discharge instructions provided? Yes   Medications obtained and verified? Yes   Other? No   Any new allergies since your discharge? No   Dietary orders reviewed? n/a  Do you have support at home? Yes   Functional Questionnaire: (I = Independent and D = Dependent) ADLs: I  Bathing/Dressing- I  Meal Prep- I  Eating- I  Maintaining continence- I  Transferring/Ambulation- I  Managing Meds- I   Follow up appointments reviewed:   PCP Hospital f/u appt confirmed? No  Pt is interested in est with a PCP. Pt given number for   Specialist Hospital f/u appt confirmed? No    Are transportation arrangements needed? No   If their condition worsens, is the pt aware to call PCP or go to the Emergency Dept.? Yes  Was the patient provided with contact information for the PCP's office or ED? Yes  Was to pt encouraged to call back with questions or concerns? Yes

## 2020-09-03 ENCOUNTER — Ambulatory Visit (HOSPITAL_COMMUNITY)
Admission: EM | Admit: 2020-09-03 | Discharge: 2020-09-03 | Disposition: A | Discharge status: Home / Self Care | Payer: Medicaid Other | Attending: Student | Admitting: Student

## 2020-09-03 ENCOUNTER — Encounter (HOSPITAL_COMMUNITY): Payer: Self-pay | Admitting: Emergency Medicine

## 2020-09-03 ENCOUNTER — Other Ambulatory Visit: Payer: Self-pay

## 2020-09-03 DIAGNOSIS — L42 Pityriasis rosea: Secondary | ICD-10-CM

## 2020-09-03 MED ORDER — PREDNISONE 20 MG PO TABS: 40.0000 mg | ORAL_TABLET | Freq: Every day | ORAL | 0 refills | Status: AC

## 2020-09-03 NOTE — ED Triage Notes (Signed)
1 1/2 weeks ago had a rash and now has spread all over back and both arms.  Patient has been using an antifungal wash and says thinks has slightly improved.  Patient reports rash does itch.

## 2020-09-03 NOTE — ED Provider Notes (Signed)
[facility]    CSN: [id number] Arrival date & time: 09/03/20  1303      History   Chief Complaint Chief Complaint  Patient presents with  . Rash    HPI Jeffrey Oneal is a 30 y.o. male presenting with rash.  Medical history noncontributory.  States he first noticed the rash about 1.5 weeks ago as 1 patch on his back.  Since then, he has developed multiple lesions on the back and a few on the chest.  Describes this as very itchy.  Has been using over-the-counter antifungal wash with minimal improvement.  Denies rash elsewhere.  Denies recent URI.  HPI  Past Medical History:  Diagnosis Date  . Anxiety     There are no problems to display for this patient.   History reviewed. No pertinent surgical history.     Home Medications    Prior to Admission medications   Medication Sig Start Date End Date Taking? Authorizing Provider  predniSONE (DELTASONE) 20 MG tablet Take 2 tablets (40 mg total) by mouth daily for 5 days. 09/03/20 09/08/20 Yes Rhys Martini, PA-C  OVER THE COUNTER MEDICATION Take 2 capsules by mouth daily. Seamoss & Burdock root 500mg     [provider]  metoprolol tartrate (LOPRESSOR) 25 MG tablet Take 0.5 tablets (12.5 mg total) by mouth 2 (two) times daily. Patient not taking: Reported on 09/03/2020 08/18/20 09/03/20  11/03/20, MD    Family History Family History  Problem Relation Age of Onset  . Heart failure Mother   . Healthy Father     Social History Social History   Tobacco Use  . Smoking status: Former Smoker    Types: Cigars  . Smokeless tobacco: Never Used  Vaping Use  . Vaping Use: Never used  Substance Use Topics  . Alcohol use: Yes    Comment: occ  . Drug use: Not Currently    Frequency: 3.0 times per week    Types: Marijuana     Allergies   Patient has no known allergies.   Review of Systems Review of Systems  Skin: Positive for rash.  All other systems reviewed and are  negative.    Physical Exam Triage Vital Signs ED Triage Vitals  Enc Vitals Group     BP      Pulse      Resp      Temp      Temp src      SpO2      Weight      Height      Head Circumference      Peak Flow      Pain Score      Pain Loc      Pain Edu?      Excl. in GC?    No data found.  Updated Vital Signs BP 121/84 (BP Location: Right Arm) Comment (BP Location): large cuff  Pulse 90   Temp 98.3 F (36.8 C) (Oral)   Resp 20   SpO2 98%   Visual Acuity Right Eye Distance:   Left Eye Distance:   Bilateral Distance:    Right Eye Near:   Left Eye Near:    Bilateral Near:     Physical Exam Vitals reviewed.  Constitutional:      General: He is not in acute distress.    Appearance: Normal appearance. He is not ill-appearing or diaphoretic.  HENT:     Head: Normocephalic and atraumatic.  Cardiovascular:  Rate and Rhythm: Normal rate and regular rhythm.     Heart sounds: Normal heart sounds.  Pulmonary:     Effort: Pulmonary effort is normal.     Breath sounds: Normal breath sounds.  Skin:    General: Skin is warm.     Comments: Scattered scaly maculopapular rash on back with few patches on abdomen. Herald patch on back, midline.   Neurological:     General: No focal deficit present.     Mental Status: He is alert and oriented to person, place, and time.  Psychiatric:        Mood and Affect: Mood normal.        Behavior: Behavior normal.        Thought Content: Thought content normal.        Judgment: Judgment normal.      UC Treatments / Results  Labs (all labs ordered are listed, but only abnormal results are displayed) Labs Reviewed - No data to display  EKG   Radiology No results found.  Procedures Procedures (including critical care time)  Medications Ordered in UC Medications - No data to display  Initial Impression / Assessment and Plan / UC Course  I have reviewed the triage vital signs and the nursing notes.  Pertinent labs &  imaging results that were available during my care of the patient were reviewed by me and considered in my medical decision making (see chart for details).     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  Discussed that this is a benign condition that will self resolve in few weeks, but prednisone sent in for relief of itching.  ED return precautions discussed.  Final Clinical Impressions(s) / UC Diagnoses   Final diagnoses:  Pityriasis rosea     Discharge Instructions     -Prednisone, 2 pills taken at the same time for 5 days in a row.  Try taking this earlier in the day as it can give you energy.  -You can also take benedryl for symptomatic relief of the itching -The rash from pityriasis rosacea can last several weeks, but it will resolve on its own over time.  The steroid will help speed up the healing. -This rash typically is not contagious between people.    ED Prescriptions    Medication Sig Dispense Auth. Provider   predniSONE (DELTASONE) 20 MG tablet Take 2 tablets (40 mg total) by mouth daily for 5 days. 10 tablet Rhys Martini, PA-C     PDMP not reviewed this encounter.   Rhys Martini, PA-C 09/03/20 1503

## 2020-09-03 NOTE — Discharge Instructions (Addendum)
-  Prednisone, 2 pills taken at the same time for 5 days in a row.  Try taking this earlier in the day as it can give you energy.  -You can also take benedryl for symptomatic relief of the itching -The rash from pityriasis rosacea can last several weeks, but it will resolve on its own over time.  The steroid will help speed up the healing. -This rash typically is not contagious between people.

## 2021-07-22 ENCOUNTER — Encounter (HOSPITAL_COMMUNITY): Payer: Self-pay | Admitting: Emergency Medicine

## 2021-07-22 ENCOUNTER — Emergency Department (HOSPITAL_COMMUNITY)
Admission: EM | Admit: 2021-07-22 | Discharge: 2021-07-22 | Disposition: A | Discharge status: Home / Self Care | Payer: Medicaid Other | Attending: Emergency Medicine | Admitting: Emergency Medicine

## 2021-07-22 DIAGNOSIS — R000 Tachycardia, unspecified: Secondary | ICD-10-CM | POA: Diagnosis not present

## 2021-07-22 DIAGNOSIS — X58XXXA Exposure to other specified factors, initial encounter: Secondary | ICD-10-CM | POA: Insufficient documentation

## 2021-07-22 DIAGNOSIS — F419 Anxiety disorder, unspecified: Secondary | ICD-10-CM | POA: Diagnosis not present

## 2021-07-22 DIAGNOSIS — Y990 Civilian activity done for income or pay: Secondary | ICD-10-CM | POA: Insufficient documentation

## 2021-07-22 DIAGNOSIS — S4992XA Unspecified injury of left shoulder and upper arm, initial encounter: Secondary | ICD-10-CM | POA: Diagnosis present

## 2021-07-22 NOTE — ED Provider Notes (Signed)
?[facility] ?Provider Note ? ? ?CSN: [id number] ?Arrival date & time: 07/22/21  2038 ? ?  ? ?History ? ?Chief Complaint  ?Patient presents with  ? Anxiety  ? ? ?Jeffrey Oneal is a 31 y.o. male with past medical history of anxiety.  Presents emergency department for complaint of anxiety.  Patient states that 4 days prior he was working out and injured his left shoulder.  Patient has been having pain intermittently to his left shoulder since then.  States that this is making very anxious so he came to the emergency department for further evaluation.  Patient states that he has dealt with anxiety on and off his entire life.  Anxiety became worse after 2017 when his child was born his mother passed away from heart attack.  Patient states that this point he became very focused on his health and feels he has a lot of anxiety regarding his health. ? ?Patient denies any chest pain, shortness of breath, nausea, vomiting, diaphoresis, abdominal pain, leg swelling or tenderness, palpitations, lightheadedness, syncope, hemoptysis, SI, HI, AVH. ? ? ?Anxiety ?Pertinent negatives include no chest pain, no abdominal pain, no headaches and no shortness of breath.  ? ?  ? ?Home Medications ?Prior to Admission medications   ?Medication Sig Start Date End Date Taking? Authorizing Provider  ?OVER THE COUNTER MEDICATION Take 2 capsules by mouth daily. Seamoss & Burdock root 500mg     [provider]  ?metoprolol tartrate (LOPRESSOR) 25 MG tablet Take 0.5 tablets (12.5 mg total) by mouth 2 (two) times daily. ?Patient not taking: Reported on 09/03/2020 08/18/20 09/03/20  Charlesetta Shanks, MD  ?   ? ?Allergies    ?Patient has no known allergies.   ? ?Review of Systems   ?Review of Systems  ?Constitutional:  Negative for chills and fever.  ?Eyes:  Negative for visual disturbance.  ?Respiratory:  Negative for shortness of breath.   ?Cardiovascular:  Negative for chest pain and leg swelling.   ?Gastrointestinal:  Negative for abdominal pain, nausea and vomiting.  ?Musculoskeletal:  Positive for arthralgias. Negative for back pain and neck pain.  ?Skin:  Negative for color change and rash.  ?Neurological:  Negative for dizziness, syncope, light-headedness and headaches.  ?Psychiatric/Behavioral:  Negative for confusion, hallucinations, self-injury and suicidal ideas. The patient is nervous/anxious.   ? ?Physical Exam ?Updated Vital Signs ?BP (!) 165/102 (BP Location: Left Arm)   Pulse (!) 113   Temp 98.1 ?F (36.7 ?C)   Resp 16   Ht 6\' 1"  (1.854 m)   Wt 89.8 kg   SpO2 100%   BMI 26.12 kg/m?  ?Physical Exam ?Vitals and nursing note reviewed.  ?Constitutional:   ?   General: He is not in acute distress. ?   Appearance: He is not ill-appearing, toxic-appearing or diaphoretic.  ?HENT:  ?   Head: Normocephalic.  ?Eyes:  ?   General: No scleral icterus.    ?   Right eye: No discharge.     ?   Left eye: No discharge.  ?Cardiovascular:  ?   Rate and Rhythm: Tachycardia present.  ?   Pulses:     ?     Radial pulses are 2+ on the right side and 2+ on the left side.  ?   Heart sounds: Normal heart sounds, S1 normal and S2 normal. No murmur heard. ?Pulmonary:  ?   Effort: Pulmonary effort is normal. No tachypnea, bradypnea or respiratory distress.  ?   Breath sounds: Normal  breath sounds. No stridor.  ?Abdominal:  ?   General: Abdomen is flat. There is no distension. There are no signs of injury.  ?   Palpations: Abdomen is soft. There is no mass or pulsatile mass.  ?   Tenderness: There is no abdominal tenderness. There is no guarding or rebound.  ?Musculoskeletal:  ?   Right lower leg: Normal.  ?   Left lower leg: Normal.  ?Skin: ?   General: Skin is warm and dry.  ?Neurological:  ?   General: No focal deficit present.  ?   Mental Status: He is alert.  ?Psychiatric:     ?   Behavior: Behavior is cooperative.  ? ? ?ED Results / Procedures / Treatments   ?Labs ?(all labs ordered are listed, but only abnormal  results are displayed) ?Labs Reviewed - No data to display ? ?EKG ?EKG Interpretation ? ?Date/Time:  Thursday July 22 2021 21:01:24 EDT ?Ventricular Rate:  113 ?PR Interval:  149 ?QRS Duration: 95 ?QT Interval:  315 ?QTC Calculation: 432 ?R Axis:   73 ?Text Interpretation: Sinus tachycardia Borderline repolarization abnormality Confirmed by Lacretia Leigh ([id number]) on 07/22/2021 9:35:44 PM ? ?Radiology ?No results found. ? ?Procedures ?Procedures  ? ? ?Medications Ordered in ED ?Medications - No data to display ? ?ED Course/ Medical Decision Making/ A&P ?  ?                        ?Medical Decision Making ? ?Alert 31 year old male no acute stress, nontoxic-appearing.  Presents emergency department complaint of anxiety. ? ?Information obtained from patient.  Past medical records were reviewed including previous provider notes, labs, and imaging.  Patient has history as outlined in HPI which complicates care. ? ?Patient reports injuring his left shoulder while working out at the gym.  Patient states that he has been hyper focused over this over the last 4 days.  Due to his fixation on his shoulder he has been very anxious about his health he came to the emergency department for further evaluation.  Patient's not having any chest pain, shortness of breath, palpitations, leg swelling or tenderness. ? ?I personally viewed and interpreted patient's EKG.  Tracing shows sinus tachycardia. ? ?While in the room patient is visibly anxious.  His anxiety increases when blood pressure Is being checked. ? ?Social determinants of health that affect patient care include no PCP. ? ?Patient advised to record blood pressures and follow-up with a PCP provider for further management of hypertension.  Patient given resources for outpatient psychiatric care/counseling. ? ?Discussed results, findings, treatment and follow up. Patient advised of return precautions. Patient verbalized understanding and agreed with plan. ? ?Patient care  discussed with attending physician Dr. Zenia Resides ? ? ? ? ? ? ? ? ?Final Clinical Impression(s) / ED Diagnoses ?Final diagnoses:  ?Anxiety  ? ? ?Rx / DC Orders ?ED Discharge Orders   ? ? None  ? ?  ? ? ?  ?Loni Beckwith, PA-C ?07/23/21 0149 ? ?  ?Lacretia Leigh, MD ?07/26/21 [phone] ? ?

## 2021-07-22 NOTE — ED Triage Notes (Signed)
Pt reports that he has had a lot of health anxiety since his mother passed from a heart attack. He did a workout a couple days ago and his arms and chest have been sore. He states that he has been waking up everyday and not feeling any better and it has made his anxiety about his health worse. He is not on any daily anxiety medication.  ?

## 2021-07-22 NOTE — Discharge Instructions (Addendum)
You came to the [facility] today to be evaluated for your anxiety.  Your physical exam and EKG were reassuring.  I have given you information for resources to help with counseling and therapy in the outpatient setting. ? ?Your blood pressure was found to be elevated while in the emergency department.  Please follow the instructions we discussed and record your blood pressure daily. ? ?Please follow instructions on your discharge paperwork or call the number on your Medicaid card to establish a primary care doctor.  They will be able to manage your blood pressure moving forward. ? ?Please return to the [facility] if you have new or worsening symptoms. ?

## 2021-07-23 ENCOUNTER — Telehealth: Payer: Self-pay

## 2021-07-23 DIAGNOSIS — Z9189 Other specified personal risk factors, not elsewhere classified: Secondary | ICD-10-CM

## 2021-07-23 NOTE — Telephone Encounter (Signed)
Transition Care Management Unsuccessful Follow-up Telephone Call ? ?Date of discharge and from where:  07/22/2021-[facility]  ? ?Attempts:  1st Attempt ? ?Reason for unsuccessful TCM follow-up call:  Left voice message ? ?  ?

## 2021-07-26 ENCOUNTER — Telehealth: Payer: Self-pay

## 2021-07-26 NOTE — Telephone Encounter (Signed)
? ?  Telephone encounter was:  Unsuccessful.  07/26/2021 ?Name: Jeffrey Oneal MRN: [id number] DOB: Jan 24, 1991 ? ?Unsuccessful outbound call made today to assist with:   pcp ? ?Outreach Attempt:  1st Attempt ? ?A HIPAA compliant voice message was left requesting a return call.  Instructed patient to call back at earliest convenience. ?. ? ? ?Lenard Forth ?Care Guide, Embedded Care Coordination ?[place], Care Management  ?[phone] ?[address] [place], [place], [place] [address] ?Phone: [phone] ?Email: [email or web address] ? ?  ?

## 2021-07-26 NOTE — Telephone Encounter (Signed)
Transition Care Management Follow-up Telephone Call ?Date of discharge and from where: 07/22/2021-[facility]  ?How have you been since you were released from the hospital? Pt stated he is feeling a lot better.  ?Any questions or concerns? No ? ?Items Reviewed: ?Did the pt receive and understand the discharge instructions provided? Yes  ?Medications obtained and verified? Yes  ?Other? No  ?Any new allergies since your discharge? No  ?Dietary orders reviewed? No ?Do you have support at home? Yes  ? ?Home Care and Equipment/Supplies: ?Were home health services ordered? not applicable ?If so, what is the name of the agency? N/A  ?Has the agency set up a time to come to the patient's home? not applicable ?Were any new equipment or medical supplies ordered?  No ?What is the name of the medical supply agency? N/A ?Were you able to get the supplies/equipment? not applicable ?Do you have any questions related to the use of the equipment or supplies? No ? ?Functional Questionnaire: (I = Independent and D = Dependent) ?ADLs: I ? ?Bathing/Dressing- I ? ?Meal Prep- I ? ?Eating- I ? ?Maintaining continence- I ? ?Transferring/Ambulation- I ? ?Managing Meds- I ? ?Follow up appointments reviewed: ? ?PCP Hospital f/u appt confirmed? No   ?Specialist Hospital f/u appt confirmed? No   ?Are transportation arrangements needed? No  ?If their condition worsens, is the pt aware to call PCP or go to the Emergency Dept.? Yes ?Was the patient provided with contact information for the PCP's office or ED? Yes ?Was to pt encouraged to call back with questions or concerns? Yes  ?

## 2021-08-03 ENCOUNTER — Telehealth: Payer: Self-pay

## 2021-08-03 NOTE — Telephone Encounter (Signed)
? ?  Telephone encounter was:  Unsuccessful.  08/03/2021 ?Name: Jeffrey Oneal MRN: [id number] DOB: 15-Jun-1990 ? ?Unsuccessful outbound call made today to assist with:   PCP ? ?Outreach Attempt:  2nd Attempt ? ?A HIPAA compliant voice message was left requesting a return call.  Instructed patient to call back at earliest convenience. ?. ? ? ? ?Larena Sox ?Care Guide, Embedded Care Coordination ?[place], Care Management  ?[phone] ?[address] [place], [place],  [address] ?Phone: [phone] ?Email: [email or web address] ? ?  ?

## 2021-08-05 ENCOUNTER — Telehealth: Payer: Self-pay

## 2021-08-05 NOTE — Telephone Encounter (Signed)
? ?  Telephone encounter was:  Unsuccessful.  08/05/2021 ?Name: Jeffrey Oneal MRN: [id number] DOB: [birth year] ? ?Unsuccessful outbound call made today to assist with:   pcp ? ?Outreach Attempt:  3rd Attempt.  Referral closed unable to contact patient. ? ?A HIPAA compliant voice message was left requesting a return call.  Instructed patient to call back at  at earliest convenience.. ? ? ? ?Larena Sox ?Care Guide, Embedded Care Coordination ?[place], Care Management  ?[phone] ?[address], [place], [place] [address] ?Phone: [phone] ?Email: [email or web address] ? ?  ?

## 2022-01-25 IMAGING — CR DG CHEST 2V
2 series · 2 of 2 positions shown · non-contrast
Comparison: None.

CLINICAL DATA: Short of breath

EXAM:
CHEST - 2 VIEW

[w chest pa]
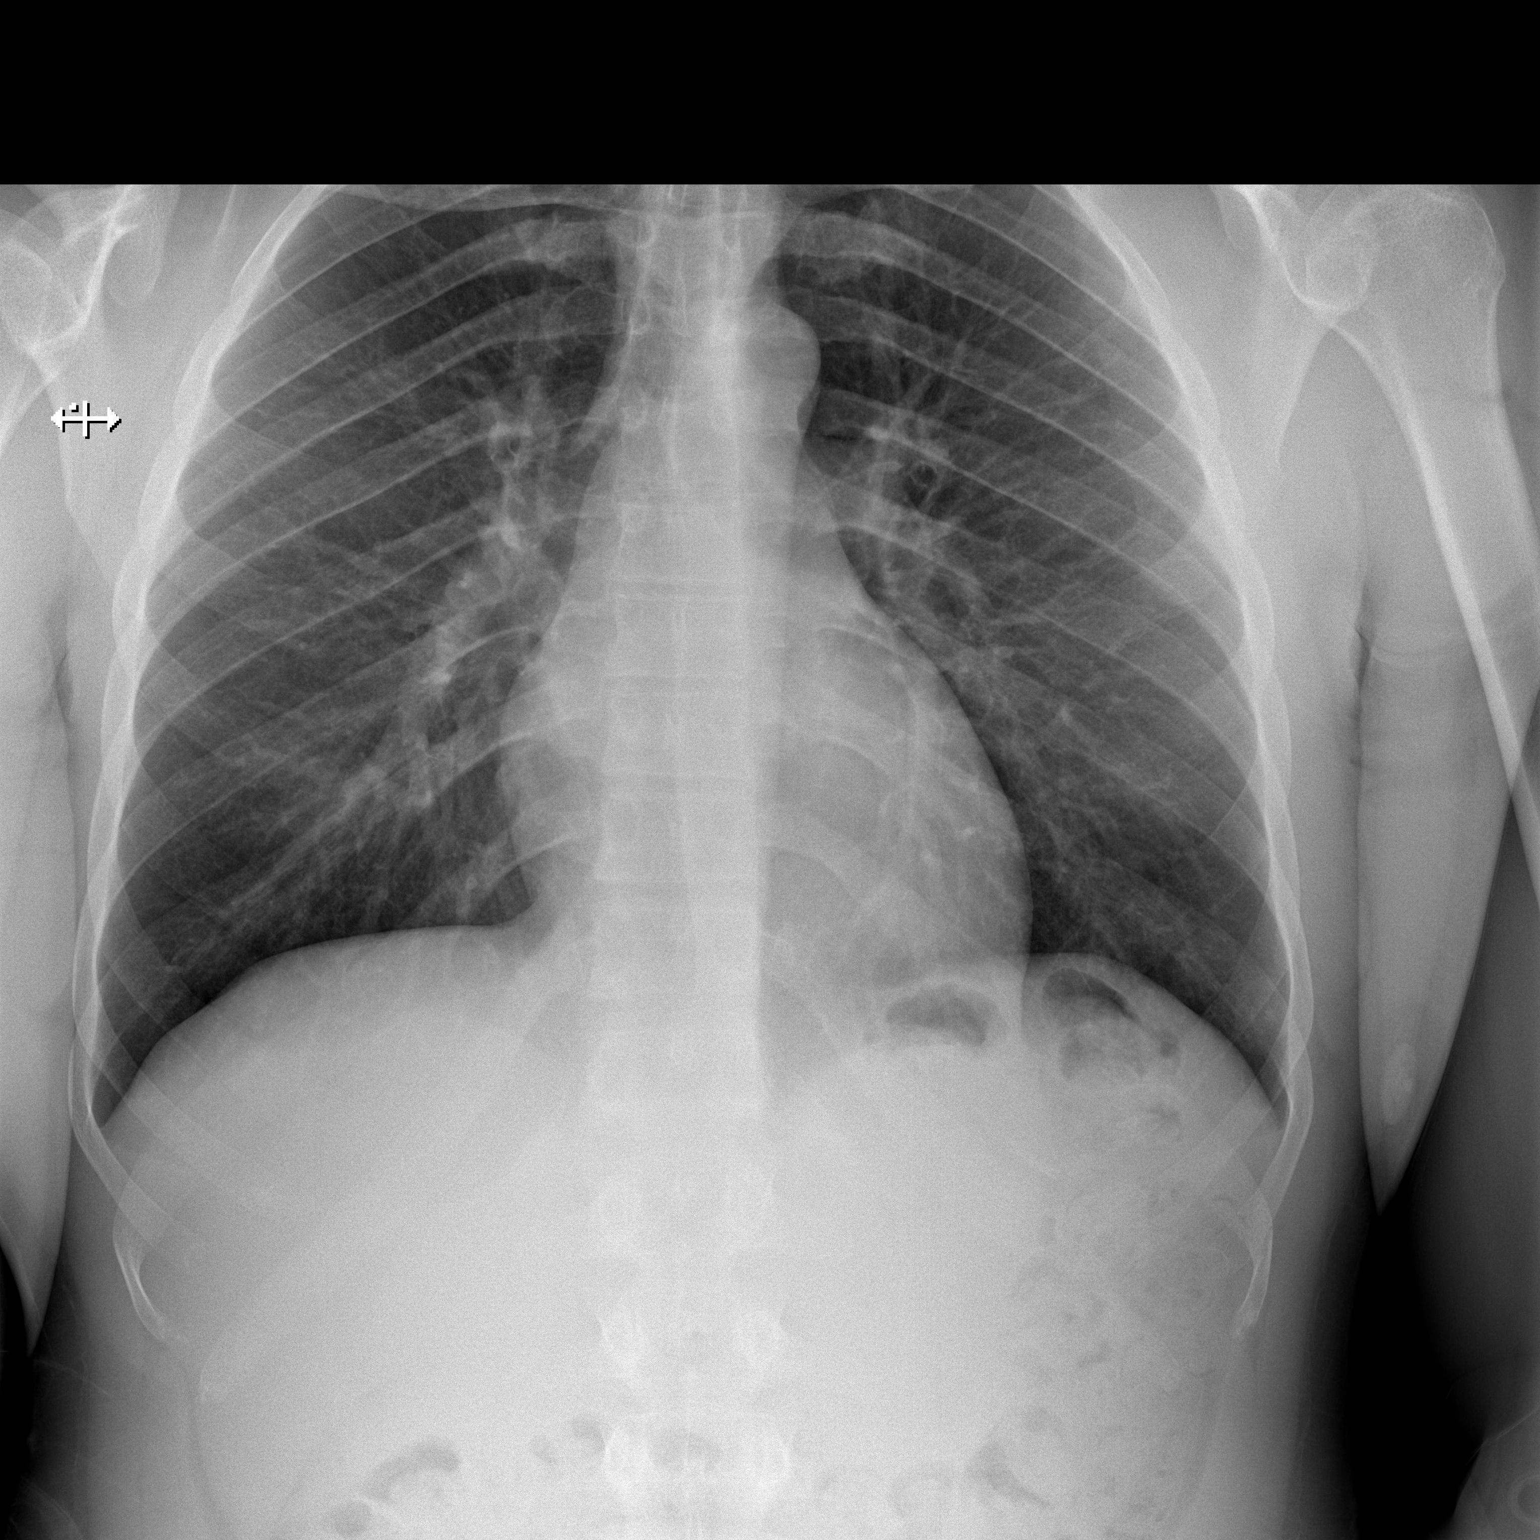

[w chest lat]
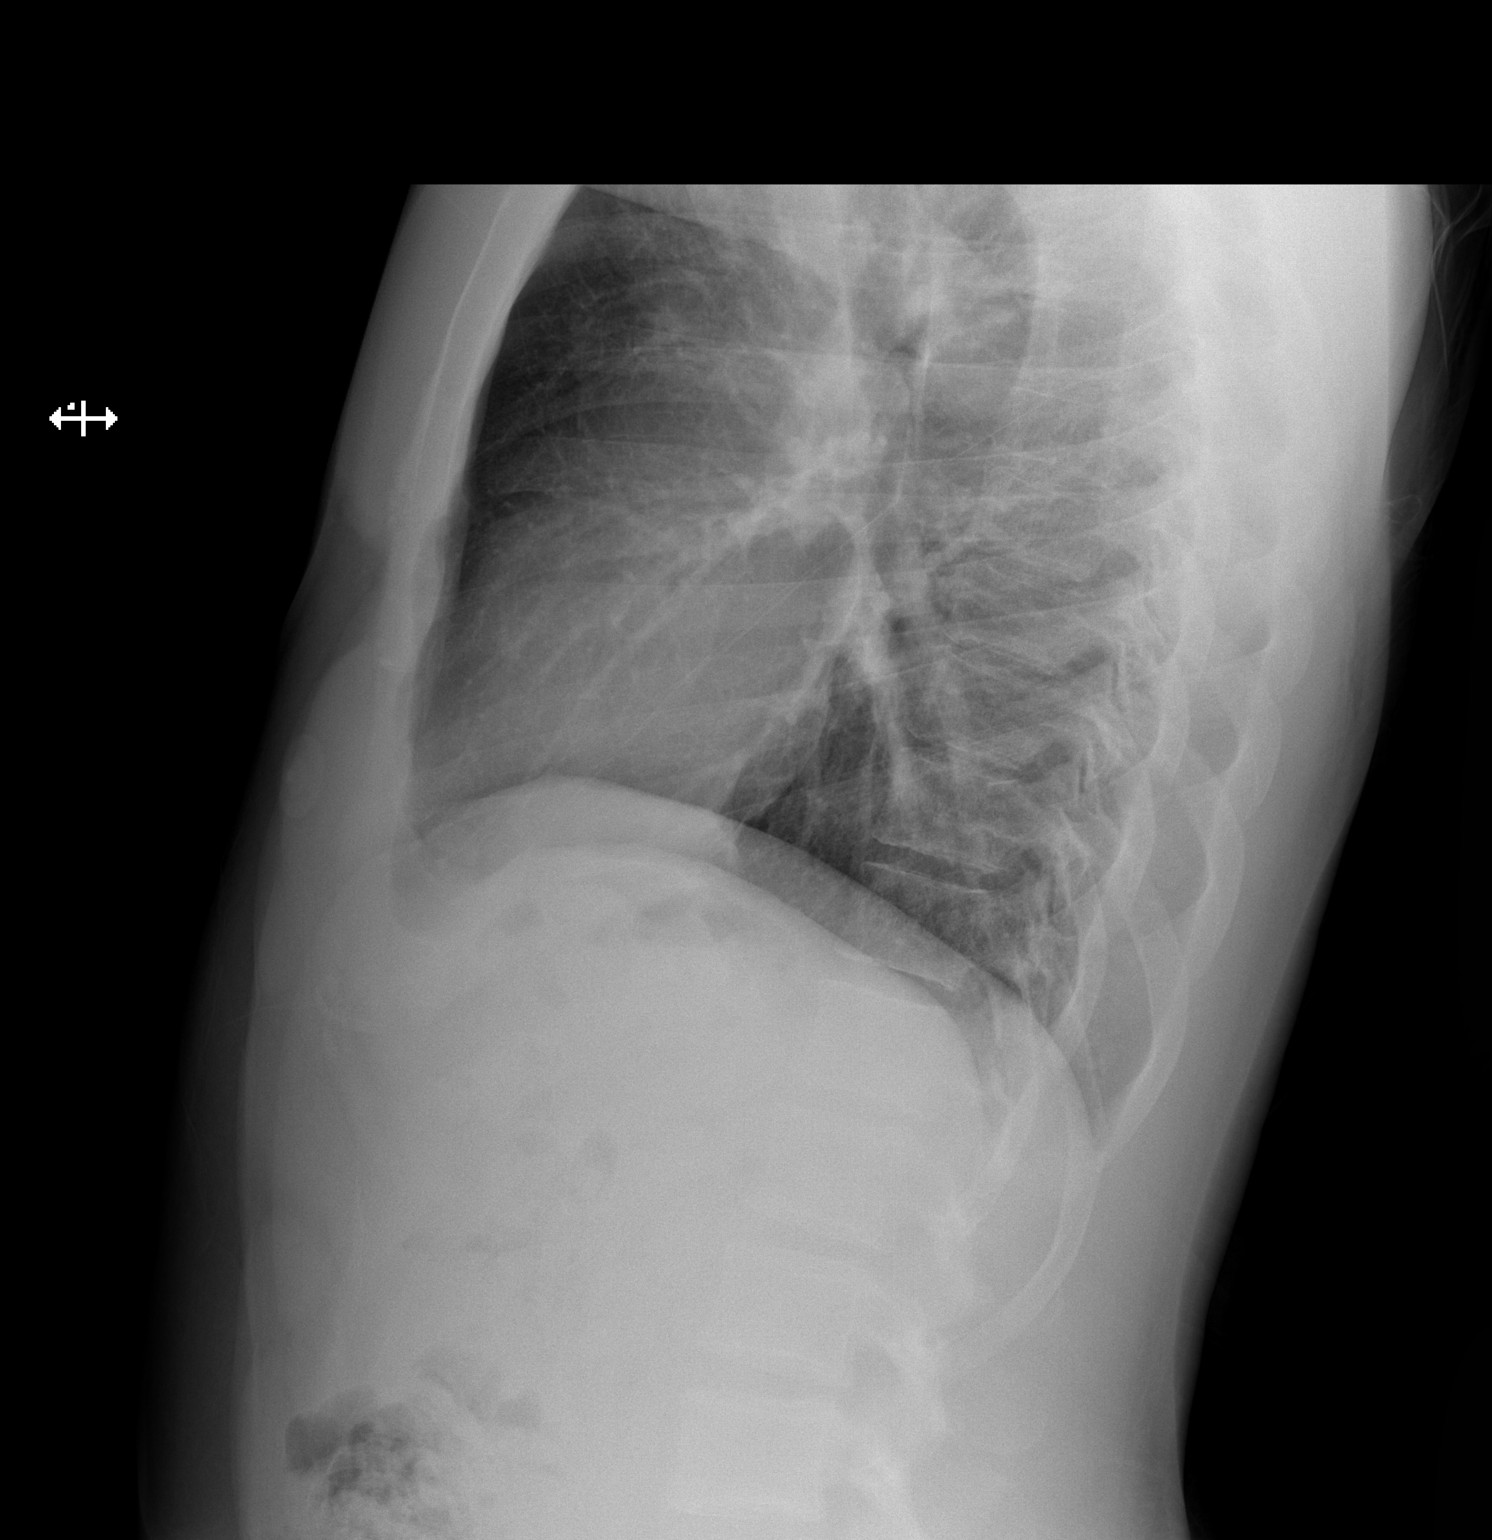

[2 of 2 positions shown; findings below may reference images not displayed]

FINDINGS: Frontal and lateral views of the chest demonstrate an unremarkable
cardiac silhouette. No airspace disease, effusion, or pneumothorax.
No acute bony abnormalities.
IMPRESSION: 1. No acute intrathoracic process.

## 2022-05-06 ENCOUNTER — Emergency Department (HOSPITAL_COMMUNITY)
Admission: EM | Admit: 2022-05-06 | Discharge: 2022-05-06 | Discharge status: Against Medical Advice | Payer: Medicaid Other

## 2022-05-06 DIAGNOSIS — K588 Other irritable bowel syndrome: Secondary | ICD-10-CM | POA: Diagnosis not present

## 2022-05-06 DIAGNOSIS — R140 Abdominal distension (gaseous): Secondary | ICD-10-CM | POA: Diagnosis not present

## 2022-05-06 NOTE — ED Notes (Signed)
Pt stated he did not want to be triaged due to wait times

## 2022-11-04 ENCOUNTER — Other Ambulatory Visit: Payer: Self-pay

## 2022-11-04 ENCOUNTER — Encounter (HOSPITAL_COMMUNITY): Payer: Self-pay

## 2022-11-04 ENCOUNTER — Emergency Department (HOSPITAL_COMMUNITY)
Admission: EM | Admit: 2022-11-04 | Discharge: 2022-11-04 | Disposition: A | Discharge status: Home / Self Care | Payer: Medicaid Other | Attending: Emergency Medicine | Admitting: Emergency Medicine

## 2022-11-04 DIAGNOSIS — W228XXA Striking against or struck by other objects, initial encounter: Secondary | ICD-10-CM | POA: Diagnosis not present

## 2022-11-04 DIAGNOSIS — Y9239 Other specified sports and athletic area as the place of occurrence of the external cause: Secondary | ICD-10-CM | POA: Diagnosis not present

## 2022-11-04 DIAGNOSIS — Z23 Encounter for immunization: Secondary | ICD-10-CM | POA: Insufficient documentation

## 2022-11-04 DIAGNOSIS — S01112A Laceration without foreign body of left eyelid and periocular area, initial encounter: Secondary | ICD-10-CM | POA: Insufficient documentation

## 2022-11-04 DIAGNOSIS — S0993XA Unspecified injury of face, initial encounter: Secondary | ICD-10-CM | POA: Diagnosis present

## 2022-11-04 DIAGNOSIS — Y9389 Activity, other specified: Secondary | ICD-10-CM | POA: Insufficient documentation

## 2022-11-04 MED ORDER — LIDOCAINE HCL 1 % IJ SOLN
INTRAMUSCULAR | Status: AC
Start: 2022-11-04 — End: 2022-11-04
  Administered 2022-11-04: 20 mL
  Filled 2022-11-04: qty 20

## 2022-11-04 MED ORDER — TETANUS-DIPHTH-ACELL PERTUSSIS 5-2.5-18.5 LF-MCG/0.5 IM SUSY
0.5000 mL | PREFILLED_SYRINGE | Freq: Once | INTRAMUSCULAR | Status: AC
Start: 2022-11-04 — End: 2022-11-04
  Administered 2022-11-04: 0.5 mL via INTRAMUSCULAR
  Filled 2022-11-04 (×2): qty 0.5

## 2022-11-04 NOTE — ED Provider Notes (Signed)
[place] EMERGENCY DEPARTMENT AT [facility] Provider Note   CSN: [id number] Arrival date & time: 11/04/22  2016     History  Chief Complaint  Patient presents with   Laceration    Jometh Manaois is a 32 y.o. male.  32 year old male with no past medical history presents to the ED status post left eyebrow laceration.  Patient was working out at [employer] when suddenly he got struck on the left side of his eye.  He reports pain to the left eyebrow, 4 cm laceration noted, bleeding is controlled with pressure.  Last tetanus immunization is unknown.  Denies any headache, loss of conscious, any blood thinners.  The history is provided by the patient.  Laceration Associated symptoms: no fever        Home Medications Prior to Admission medications   Medication Sig Start Date End Date Taking? Authorizing Provider  Omega-3 Fatty Acids (FISH OIL PO) Take 2 capsules by mouth See admin instructions. Take 2 capsules by mouth every other day, when not taking Seamoss & Burdock root 500 mg capsules    [provider]  OVER THE COUNTER MEDICATION Take 2 capsules by mouth See admin instructions. Seamoss & Burdock root 500 mg capsules- Take 2 capsules by mouth every other day, when not taking fish oil capsules    [provider]  metoprolol tartrate (LOPRESSOR) 25 MG tablet Take 0.5 tablets (12.5 mg total) by mouth 2 (two) times daily. Patient not taking: Reported on 09/03/2020 08/18/20 09/03/20  Arby Barrette, MD      Allergies    Patient has no known allergies.    Review of Systems   Review of Systems  Constitutional:  Negative for fever.  Skin:  Positive for wound.    Physical Exam Updated Vital Signs BP 126/78   Pulse 84   Temp 99.8 F (37.7 C) (Oral)   Resp 20   SpO2 100%  Physical Exam Vitals and nursing note reviewed.  Constitutional:      Appearance: Normal appearance.  HENT:     Head: Normocephalic and atraumatic.     Mouth/Throat:      Mouth: Mucous membranes are moist.  Eyes:     Extraocular Movements: Extraocular movements intact.     Right eye: Normal extraocular motion.     Left eye: Normal extraocular motion.   Cardiovascular:     Rate and Rhythm: Normal rate.  Pulmonary:     Effort: Pulmonary effort is normal.  Abdominal:     General: Abdomen is flat.  Musculoskeletal:     Cervical back: Normal range of motion and neck supple.  Skin:    General: Skin is warm.     Findings: Bruising and erythema present.  Neurological:     Mental Status: He is alert and oriented to person, place, and time.     ED Results / Procedures / Treatments   Labs (all labs ordered are listed, but only abnormal results are displayed) Labs Reviewed - No data to display  EKG None  Radiology No results found.  Procedures .Marland KitchenLaceration Repair  Date/Time: 11/04/2022 9:03 PM  Performed by: Claude Manges, PA-C Authorized by: Claude Manges, PA-C   Consent:    Consent obtained:  Verbal   Consent given by:  Patient   Risks discussed:  Infection   Alternatives discussed:  No treatment Universal protocol:    Patient identity confirmed:  Verbally with patient Anesthesia:    Anesthesia method:  Local infiltration   Local  anesthetic:  Lidocaine 1% w/o epi Laceration details:    Location:  Face   Face location:  L eyebrow   Length (cm):  4   Depth (mm):  1 Treatment:    Area cleansed with:  Saline   Amount of cleaning:  Extensive   Irrigation solution:  Sterile saline   Debridement:  Extensive   Scar revision: no   Skin repair:    Repair method:  Sutures   Suture size:  5-0   Suture material:  Prolene   Suture technique:  Simple interrupted   Number of sutures:  4 Approximation:    Approximation:  Close Repair type:    Repair type:  Simple Post-procedure details:    Dressing:  Open (no dressing)   Procedure completion:  Tolerated well, no immediate complications     Medications Ordered in ED Medications  Tdap  (BOOSTRIX) injection 0.5 mL (0.5 mLs Intramuscular Given 11/04/22 2056)  lidocaine (XYLOCAINE) 1 % (with pres) injection (20 mLs  Given by Other 11/04/22 2055)    ED Course/ Medical Decision Making/ A&P                             Medical Decision Making Risk Prescription drug management.   Patient presents to the ED status post facial injury.  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  No loss of consciousness, no headache, no blood thinners.  Arrived in the ED with slight elevation in his heart rate, temperature slightly elevated however denies any sick contacts at this time.  Last tetanus immunization is several years ago, this was updated on today's visit.  Wound was irrigated thoroughly, cleaned by me.  I did repair this wound with 4 stitches, we discussed wound management along with care.  Patient is hemodynamically stable for discharge.    Portions of this note were generated with Scientist, clinical (histocompatibility and immunogenetics). Dictation errors may occur despite best attempts at proofreading.   Final Clinical Impression(s) / ED Diagnoses Final diagnoses:  Laceration of left eyebrow, initial encounter    Rx / DC Orders ED Discharge Orders     None         Freddy Jaksch 11/04/22 2117    Linwood Dibbles, MD 11/06/22 [phone]
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
## 2022-11-04 NOTE — Discharge Instructions (Addendum)
You had 4 stitches placed to your left eyebrow, you will need to have these removed within 5 to 7 days.  You may apply Neosporin to the wound, keep the wound uncovered when you are home.

## 2022-11-09 ENCOUNTER — Ambulatory Visit (HOSPITAL_COMMUNITY)
Admission: EM | Admit: 2022-11-09 | Discharge: 2022-11-09 | Disposition: A | Discharge status: Home / Self Care | Payer: Medicaid Other

## 2022-11-09 ENCOUNTER — Encounter (HOSPITAL_COMMUNITY): Payer: Self-pay

## 2022-11-09 DIAGNOSIS — R030 Elevated blood-pressure reading, without diagnosis of hypertension: Secondary | ICD-10-CM

## 2022-11-09 DIAGNOSIS — Z4802 Encounter for removal of sutures: Secondary | ICD-10-CM | POA: Diagnosis not present

## 2022-11-09 DIAGNOSIS — S01112D Laceration without foreign body of left eyelid and periocular area, subsequent encounter: Secondary | ICD-10-CM | POA: Diagnosis not present

## 2022-11-09 NOTE — Discharge Instructions (Addendum)
Keep this area clean with soap and water.  Do not do anything that could potentially injure the area to prevent it from opening up again.  If there are any signs of infection including redness, bleeding, drainage, swelling, pain you should be seen immediately.  Your blood pressure is slightly elevated.  I do recommend you monitor this at home and follow-up with your primary care.  If you develop any, chest pain, shortness of breath, headache, vision change, dizziness in the setting of high blood pressure you should be seen immediately.

## 2022-11-09 NOTE — ED Triage Notes (Signed)
Pt is here to have sutures removed from right eyebrow. They were placed at [facility] 5 days ago. Pt was told to come 7 days after to have them removed but he wants them out now if possible.
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
## 2023-03-04 DIAGNOSIS — S91301A Unspecified open wound, right foot, initial encounter: Secondary | ICD-10-CM | POA: Diagnosis not present

## 2023-03-04 DIAGNOSIS — S91331A Puncture wound without foreign body, right foot, initial encounter: Secondary | ICD-10-CM | POA: Diagnosis not present

## 2023-03-10 ENCOUNTER — Encounter (HOSPITAL_COMMUNITY): Payer: Self-pay

## 2023-03-10 ENCOUNTER — Ambulatory Visit (HOSPITAL_COMMUNITY)
Admission: EM | Admit: 2023-03-10 | Discharge: 2023-03-10 | Disposition: A | Discharge status: Home / Self Care | Payer: Medicaid Other | Attending: Emergency Medicine | Admitting: Emergency Medicine

## 2023-03-10 DIAGNOSIS — R030 Elevated blood-pressure reading, without diagnosis of hypertension: Secondary | ICD-10-CM | POA: Diagnosis not present

## 2023-03-10 DIAGNOSIS — Z5189 Encounter for other specified aftercare: Secondary | ICD-10-CM

## 2023-03-10 DIAGNOSIS — S91331A Puncture wound without foreign body, right foot, initial encounter: Secondary | ICD-10-CM | POA: Diagnosis not present

## 2023-03-10 NOTE — Discharge Instructions (Signed)
Continue doing wound care as you have been doing.  Continue Tylenol and ibuprofen 3 times a day together as needed for pain.  Return here for any signs of infection.  We have set you up with a PCP prior to discharge.  Follow-up with them as scheduled.  Decrease your salt intake. diet and exercise will lower your blood pressure significantly. It is important to keep your blood pressure under good control, as having a elevated blood pressure for prolonged periods of time significantly increases your risk of stroke, heart attacks, kidney damage, eye damage, and other problems. Get a validated blood pressure cuff that goes on your arm, not your wrist.  Measure your blood pressure once a day, preferably at the same time every day. Keep a log of this and bring it to your next doctor's appointment.  Bring your blood pressure cuff as well.  Return here in 2 weeks for blood pressure recheck if you're unable to find a primary care physician by then. Return immediately to the ER if you start having chest pain, headache, problems seeing, problems talking, problems walking, if you feel like you're about to pass out, if you do pass out, if you have a seizure, or for any other concerns.  Go to www.goodrx.com  or www.costplusdrugs.com to look up your medications. This will give you a list of where you can find your prescriptions at the most affordable prices. Or ask the pharmacist what the cash price is, or if they have any other discount programs available to help make your medication more affordable. This can be less expensive than what you would pay with insurance.
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
## 2023-05-23 ENCOUNTER — Telehealth: Payer: Self-pay | Admitting: General Practice

## 2023-05-23 ENCOUNTER — Ambulatory Visit: Payer: Medicaid Other | Admitting: Family Medicine

## 2023-05-23 NOTE — Telephone Encounter (Signed)
Called patient to reschedule missed appointment no answer left voicemail

## 2023-11-10 ENCOUNTER — Encounter (HOSPITAL_COMMUNITY): Payer: Self-pay | Admitting: *Deleted

## 2023-11-10 ENCOUNTER — Ambulatory Visit (HOSPITAL_COMMUNITY)
Admission: EM | Admit: 2023-11-10 | Discharge: 2023-11-10 | Disposition: A | Discharge status: Home / Self Care | Attending: Family Medicine | Admitting: Family Medicine

## 2023-11-10 DIAGNOSIS — N50812 Left testicular pain: Secondary | ICD-10-CM | POA: Diagnosis not present

## 2023-11-10 DIAGNOSIS — R1032 Left lower quadrant pain: Secondary | ICD-10-CM | POA: Insufficient documentation

## 2023-11-10 DIAGNOSIS — R1031 Right lower quadrant pain: Secondary | ICD-10-CM | POA: Diagnosis not present

## 2023-11-10 DIAGNOSIS — M545 Low back pain, unspecified: Secondary | ICD-10-CM | POA: Diagnosis not present

## 2023-11-10 LAB — POCT URINALYSIS DIP (MANUAL ENTRY)
Bilirubin, UA: NEGATIVE
Blood, UA: NEGATIVE
Glucose, UA: NEGATIVE mg/dL
Ketones, POC UA: NEGATIVE mg/dL
Leukocytes, UA: NEGATIVE
Nitrite, UA: NEGATIVE
Protein Ur, POC: NEGATIVE mg/dL
Spec Grav, UA: 1.015 (ref 1.010–1.025)
Urobilinogen, UA: 1 U/dL
pH, UA: 5.5 (ref 5.0–8.0)

## 2023-11-10 MED ORDER — CEFTRIAXONE SODIUM 500 MG IJ SOLR
INTRAMUSCULAR | Status: AC
Start: 2023-11-10 — End: 2023-11-10
  Filled 2023-11-10: qty 500

## 2023-11-10 MED ORDER — CEFTRIAXONE SODIUM 500 MG IJ SOLR
500.0000 mg | INTRAMUSCULAR | Status: DC
  Administered 2023-11-10: 500 mg via INTRAMUSCULAR

## 2023-11-10 MED ORDER — LIDOCAINE HCL (PF) 1 % IJ SOLN
INTRAMUSCULAR | Status: AC
Start: 2023-11-10 — End: 2023-11-10
  Filled 2023-11-10: qty 2

## 2023-11-10 MED ORDER — DOXYCYCLINE HYCLATE 100 MG PO CAPS: 100.0000 mg | ORAL_CAPSULE | Freq: Two times a day (BID) | ORAL | 0 refills | Status: AC

## 2023-11-10 NOTE — Discharge Instructions (Addendum)
 You received injection of Rocephin in clinic today to treat for potential underlying epididymitis. I also want you to start taking doxycycline twice daily for 7 days for this as well. Your results will return over the next few days and someone will call if results are positive or require adjustment in treatment plan. Otherwise follow-up with your primary care provider if your pain continues.  You can also follow-up with [facility] for further evaluation if you continue to have groin and testicle pain. Alternate between 650 mg of Tylenol  and 400 mg ibuprofen every 6-8 hours as needed for pain. Return here as needed.

## 2023-11-10 NOTE — ED Provider Notes (Signed)
 [facility]    CSN: [id number] Arrival date & time: 11/10/23  9193      History   Chief Complaint Chief Complaint  Patient presents with   Groin Pain   Back Pain    HPI Jeffrey Oneal is a 33 y.o. male.   Patient presents with intermittent testicle, groin, and low back pain x 5 days.  Patient states that he initially had left-sided testicle pain that radiated to his bilateral groin region.  Patient states that he then began to have bilateral low back pain as well.  Patient states that he has noticed some mild dysuria occasionally when urinating.  Denies hematuria, urinary frequency/urgency, penile discharge, penile or testicular swelling, or genital lesions.  Patient also denies tenderness to testicles and states that the pain is dull and achy.  Patient states that he has been drinking lots of cranberry juice and water over the last few days in order to flush his urinary tract.  Patient states that he does have a history of epididymitis and wonders if this could be the same.  Patient states that he is only sexually active with his wife.  Denies any known exposures to STDs.  Denies fever, nausea, vomiting, diarrhea, and abdominal pain.  The history is provided by the patient and medical records.  Groin Pain  Back Pain   Past Medical History:  Diagnosis Date   Anxiety     There are no active problems to display for this patient.   History reviewed. No pertinent surgical history.     Home Medications    Prior to Admission medications   Medication Sig Start Date End Date Taking? Authorizing Provider  doxycycline (VIBRAMYCIN) 100 MG capsule Take 1 capsule (100 mg total) by mouth 2 (two) times daily for 7 days. 11/10/23 11/17/23 Yes Johnie Flaming A, NP  Omega-3 Fatty Acids (FISH OIL PO) Take 2 capsules by mouth See admin instructions. Take 2 capsules by mouth every other day, when not taking Seamoss & Burdock root 500 mg capsules   Yes [provider]   OVER THE COUNTER MEDICATION Take 2 capsules by mouth See admin instructions. Seamoss & Burdock root 500 mg capsules- Take 2 capsules by mouth every other day, when not taking fish oil capsules   Yes [provider]  metoprolol  tartrate (LOPRESSOR ) 25 MG tablet Take 0.5 tablets (12.5 mg total) by mouth 2 (two) times daily. Patient not taking: Reported on 09/03/2020 08/18/20 09/03/20  Armenta Canning, MD    Family History Family History  Problem Relation Age of Onset   Heart failure Mother    Healthy Father     Social History Social History   Tobacco Use   Smoking status: Former    Types: Cigars   Smokeless tobacco: Never  Vaping Use   Vaping status: Never Used  Substance Use Topics   Alcohol use: Yes    Comment: occ   Drug use: Not Currently    Frequency: 3.0 times per week    Types: Marijuana     Allergies   Patient has no known allergies.   Review of Systems Review of Systems  Musculoskeletal:  Positive for back pain.   Per HPI  Physical Exam Triage Vital Signs ED Triage Vitals  Encounter Vitals Group     BP 11/10/23 0842 (!) 144/95     Girls Systolic BP Percentile --      Girls Diastolic BP Percentile --      Boys Systolic BP Percentile --  Boys Diastolic BP Percentile --      Pulse Rate 11/10/23 0842 91     Resp 11/10/23 0842 16     Temp 11/10/23 0842 97.6 F (36.4 C)     Temp Source 11/10/23 0842 Oral     SpO2 11/10/23 0842 96 %     Weight --      Height --      Head Circumference --      Peak Flow --      Pain Score 11/10/23 0840 6     Pain Loc --      Pain Education --      Exclude from Growth Chart --    No data found.  Updated Vital Signs BP (!) 144/95 (BP Location: Left Arm)   Pulse 91   Temp 97.6 F (36.4 C) (Oral)   Resp 16   SpO2 96%   Visual Acuity Right Eye Distance:   Left Eye Distance:   Bilateral Distance:    Right Eye Near:   Left Eye Near:    Bilateral Near:     Physical Exam Vitals and nursing note  reviewed.  Constitutional:      General: He is awake. He is not in acute distress.    Appearance: Normal appearance. He is well-developed and well-groomed. He is not ill-appearing.  Abdominal:     General: Abdomen is flat. Bowel sounds are normal.     Palpations: Abdomen is soft.     Tenderness: There is no abdominal tenderness. There is no right CVA tenderness or left CVA tenderness.  Genitourinary:    Comments: Exam deferred Musculoskeletal:     Lumbar back: Tenderness present. No swelling, edema or bony tenderness. Normal range of motion. Negative right straight leg raise test and negative left straight leg raise test.       Back:     Right upper leg: Normal.     Left upper leg: Normal.     Comments: Nontender to bilateral groin.  Tenderness noted to bilateral low back without spinous process tenderness  Skin:    General: Skin is warm and dry.  Neurological:     Mental Status: He is alert.  Psychiatric:        Behavior: Behavior is cooperative.      UC Treatments / Results  Labs (all labs ordered are listed, but only abnormal results are displayed) Labs Reviewed  URINE CULTURE  POCT URINALYSIS DIP (MANUAL ENTRY)  CYTOLOGY, (ORAL, ANAL, URETHRAL) ANCILLARY ONLY    EKG   Radiology No results found.  Procedures Procedures (including critical care time)  Medications Ordered in UC Medications  cefTRIAXone (ROCEPHIN) injection 500 mg (500 mg Intramuscular Given 11/10/23 0928)    Initial Impression / Assessment and Plan / UC Course  I have reviewed the triage vital signs and the nursing notes.  Pertinent labs & imaging results that were available during my care of the patient were reviewed by me and considered in my medical decision making (see chart for details).     Patient is overall well-appearing.  Vitals are stable.  GU exam deferred.  Patient performed self swab for STD.  Urinalysis unremarkable, will send urine culture to confirm.  Empirically treating  for epididymitis with IM Rocephin and doxycycline.  Discussed follow-up and return precautions. Final Clinical Impressions(s) / UC Diagnoses   Final diagnoses:  Pain in left testicle  Bilateral groin pain  Acute bilateral low back pain without sciatica     Discharge Instructions  You received injection of Rocephin in clinic today to treat for potential underlying epididymitis. I also want you to start taking doxycycline twice daily for 7 days for this as well. Your results will return over the next few days and someone will call if results are positive or require adjustment in treatment plan. Otherwise follow-up with your primary care provider if your pain continues.  You can also follow-up with [facility] for further evaluation if you continue to have groin and testicle pain. Alternate between 650 mg of Tylenol  and 400 mg ibuprofen every 6-8 hours as needed for pain. Return here as needed.   ED Prescriptions     Medication Sig Dispense Auth. Provider   doxycycline (VIBRAMYCIN) 100 MG capsule Take 1 capsule (100 mg total) by mouth 2 (two) times daily for 7 days. 14 capsule Johnie Flaming A, NP      PDMP not reviewed this encounter.   Johnie Flaming A, NP 11/10/23 [phone]
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
## 2023-11-11 LAB — URINE CULTURE: Culture: NO GROWTH

## 2023-11-13 ENCOUNTER — Ambulatory Visit (HOSPITAL_COMMUNITY): Payer: Self-pay

## 2023-11-13 LAB — CYTOLOGY, (ORAL, ANAL, URETHRAL) ANCILLARY ONLY
Chlamydia: NEGATIVE
Comment: NEGATIVE
Comment: NEGATIVE
Comment: NORMAL
Neisseria Gonorrhea: NEGATIVE
Trichomonas: NEGATIVE

## 2024-03-12 DIAGNOSIS — R030 Elevated blood-pressure reading, without diagnosis of hypertension: Secondary | ICD-10-CM | POA: Diagnosis not present

## 2024-03-12 DIAGNOSIS — R42 Dizziness and giddiness: Secondary | ICD-10-CM | POA: Diagnosis not present

## 2024-03-12 DIAGNOSIS — R194 Change in bowel habit: Secondary | ICD-10-CM | POA: Diagnosis not present

## 2024-03-12 DIAGNOSIS — R1011 Right upper quadrant pain: Secondary | ICD-10-CM | POA: Diagnosis not present

## 2024-03-14 DIAGNOSIS — R1011 Right upper quadrant pain: Secondary | ICD-10-CM | POA: Diagnosis not present
